# Patient Record
Sex: Male | Born: 1945
Health system: Southern US, Community
[De-identification: ages and names within clinical notes are randomized; demographics above are authoritative.]

## PROBLEM LIST (undated history)

## (undated) DIAGNOSIS — R351 Nocturia: Secondary | ICD-10-CM

## (undated) DIAGNOSIS — Z87448 Personal history of other diseases of urinary system: Secondary | ICD-10-CM

## (undated) DIAGNOSIS — I8390 Asymptomatic varicose veins of unspecified lower extremity: Secondary | ICD-10-CM

## (undated) DIAGNOSIS — M7061 Trochanteric bursitis, right hip: Secondary | ICD-10-CM

## (undated) DIAGNOSIS — M1990 Unspecified osteoarthritis, unspecified site: Secondary | ICD-10-CM

## (undated) DIAGNOSIS — F329 Major depressive disorder, single episode, unspecified: Secondary | ICD-10-CM

## (undated) DIAGNOSIS — G4733 Obstructive sleep apnea (adult) (pediatric): Secondary | ICD-10-CM

## (undated) DIAGNOSIS — F32A Depression, unspecified: Secondary | ICD-10-CM

## (undated) DIAGNOSIS — G5603 Carpal tunnel syndrome, bilateral upper limbs: Secondary | ICD-10-CM

## (undated) DIAGNOSIS — K219 Gastro-esophageal reflux disease without esophagitis: Secondary | ICD-10-CM

## (undated) DIAGNOSIS — I10 Essential (primary) hypertension: Secondary | ICD-10-CM

## (undated) DIAGNOSIS — K5790 Diverticulosis of intestine, part unspecified, without perforation or abscess without bleeding: Secondary | ICD-10-CM

## (undated) DIAGNOSIS — I4892 Unspecified atrial flutter: Secondary | ICD-10-CM

## (undated) DIAGNOSIS — Z8719 Personal history of other diseases of the digestive system: Secondary | ICD-10-CM

## (undated) DIAGNOSIS — E785 Hyperlipidemia, unspecified: Secondary | ICD-10-CM

## (undated) HISTORY — DX: Asymptomatic varicose veins of unspecified lower extremity: I83.90

## (undated) HISTORY — DX: Hyperlipidemia, unspecified: E78.5

## (undated) HISTORY — DX: Depression, unspecified: F32.A

## (undated) HISTORY — DX: Personal history of other diseases of urinary system: Z87.448

## (undated) HISTORY — DX: Nocturia: R35.1

## (undated) HISTORY — PX: EYE SURGERY: SHX253

## (undated) HISTORY — DX: Personal history of other diseases of the digestive system: Z87.19

## (undated) HISTORY — DX: Unspecified osteoarthritis, unspecified site: M19.90

## (undated) HISTORY — DX: Essential (primary) hypertension: I10

## (undated) HISTORY — DX: Diverticulosis of intestine, part unspecified, without perforation or abscess without bleeding: K57.90

## (undated) HISTORY — DX: Gastro-esophageal reflux disease without esophagitis: K21.9

## (undated) HISTORY — DX: Major depressive disorder, single episode, unspecified: F32.9

---

## 2003-05-24 ENCOUNTER — Ambulatory Visit (HOSPITAL_COMMUNITY): Admission: RE | Admit: 2003-05-24 | Discharge: 2003-05-24 | Payer: Self-pay | Admitting: Gastroenterology

## 2004-05-20 ENCOUNTER — Encounter: Admission: RE | Admit: 2004-05-20 | Discharge: 2004-05-20 | Payer: Self-pay | Admitting: Gastroenterology

## 2006-05-30 ENCOUNTER — Encounter: Admission: RE | Admit: 2006-05-30 | Discharge: 2006-05-30 | Payer: Self-pay | Admitting: Gastroenterology

## 2010-08-08 ENCOUNTER — Encounter: Payer: Self-pay | Admitting: Gastroenterology

## 2011-07-02 ENCOUNTER — Other Ambulatory Visit: Payer: Self-pay

## 2011-07-02 DIAGNOSIS — I83893 Varicose veins of bilateral lower extremities with other complications: Secondary | ICD-10-CM

## 2011-07-22 ENCOUNTER — Encounter: Payer: Self-pay | Admitting: Vascular Surgery

## 2011-07-23 ENCOUNTER — Encounter: Payer: Self-pay | Admitting: Vascular Surgery

## 2011-07-26 ENCOUNTER — Encounter: Payer: Self-pay | Admitting: Vascular Surgery

## 2011-07-26 ENCOUNTER — Ambulatory Visit (INDEPENDENT_AMBULATORY_CARE_PROVIDER_SITE_OTHER): Payer: MEDICARE | Admitting: Vascular Surgery

## 2011-07-26 ENCOUNTER — Other Ambulatory Visit (INDEPENDENT_AMBULATORY_CARE_PROVIDER_SITE_OTHER): Payer: MEDICARE | Admitting: *Deleted

## 2011-07-26 VITALS — BP 143/67 | HR 55 | Resp 18 | Ht 72.0 in | Wt 230.0 lb

## 2011-07-26 DIAGNOSIS — I83893 Varicose veins of bilateral lower extremities with other complications: Secondary | ICD-10-CM | POA: Insufficient documentation

## 2011-07-26 DIAGNOSIS — I8310 Varicose veins of unspecified lower extremity with inflammation: Secondary | ICD-10-CM

## 2011-07-26 NOTE — Progress Notes (Signed)
Subjective:     Patient ID: Jason Beard, male   DOB: [birth year], 66 y.o.   MRN: [id number]  HPI this healthy 66 year old male patient was referred for severe venous insufficiency both legs right worse than left. He has been having enlarging varicosities in the medial thigh areas bilaterally right worse than left. These have begun causing aching throbbing and burning discomfort as the day progresses. He also developed swelling in the ankle and foot as the day progresses. He has no history of DVT, thrombophlebitis, stasis ulcers, bleeding, or other complications. He has had these evaluated in the past at [facility] and recommendation for closure of the veins was made but this never occurred. He wore elastic compression stockings intermittently at that time but has not been wearing them on a regular basis.  Past Medical History  Diagnosis Date  . Hyperlipidemia   . History of hemorrhoids   . Asthma   . GERD (gastroesophageal reflux disease)   . Arthritis     DJD  . Nocturia   . Depression   . Diverticulosis   . History of simple renal cyst   . Diabetes mellitus   . Varicose veins     Left Leg    History  Substance Use Topics  . Smoking status: Former Smoker -- 30 years    Quit date: 07/19/1984  . Smokeless tobacco: Not on file  . Alcohol Use: 0.6 oz/week    1 Glasses of wine per week    Family History  Problem Relation Age of Onset  . Cancer Mother   . Heart disease Father   . Cancer Sister     Allergies  Allergen Reactions  . Ampicillin     Current outpatient prescriptions:aspirin 81 MG tablet, Take 81 mg by mouth daily as needed. , Disp: , Rfl: ;  atorvastatin (LIPITOR) 10 MG tablet, Take 10 mg by mouth daily.  , Disp: , Rfl: ;  Coenzyme Q10 (COQ10) 200 MG CAPS, Take by mouth daily.  , Disp: , Rfl: ;  fluticasone (FLONASE) 50 MCG/ACT nasal spray, Place 2 sprays into the nose daily.  , Disp: , Rfl: ;  Glucosamine 500 MG TABS, Take 2 tablets by mouth 2 (two) times  daily. , Disp: , Rfl:  metFORMIN (GLUCOPHAGE) 500 MG tablet, Take 500 mg by mouth 1 day or 1 dose. , Disp: , Rfl: ;  naproxen sodium (ANAPROX) 220 MG tablet, Take 220 mg by mouth as needed.  , Disp: , Rfl: ;  Omega-3 Fatty Acids (FISH OIL) 1000 MG CAPS, Take by mouth daily.  , Disp: , Rfl: ;  zolpidem (AMBIEN) 10 MG tablet, Take 5 mg by mouth at bedtime as needed. , Disp: , Rfl:   BP 143/67  Pulse 55  Resp 18  Ht 6' (1.829 m)  Wt 230 lb (104.327 kg)  BMI 31.19 kg/m2  Body mass index is 31.19 kg/(m^2).             Review of Systems he denies chest pain, dyspnea on exertion, PND, orthopnea, or hemoptysis. He also denies lateralizing weakness, amaurosis fugax, diplopia, or syncope. All other systems are negative and complete review of systems     Objective:   Physical Exam pressure 144/67 heart rate 65 respirations 18   general well-developed well-nourished male in no apparent distress alert and oriented x3 HEENT normal for age Lungs no rhonchi or wheezing Cardiovascular regular rhythm no murmurs carotid pulses 3+ no bruits audible Abdomen soft nontender no palpable  masses Neurologic normal Musculoskeletal 3 major deformities Extremities 3+ femoral popliteal dorsalis pedis and posterior tibial pulses palpable bilaterally. There are bulging varicosities in the right medial thigh with multiple patterns of reticular veins. There is 1+ edema distally on the right. Left leg is no hyperpigmentation or ulceration but there are bulging reticular veins in the distal medial thigh.  Today I ordered bilateral venous duplex exam which I reviewed and interpreted. Both great saphenous veins have gross reflux from the knee to the saphenofemoral junction and the deep systems are normal. Assessment:     Bilateral venous insufficiency with reflux bilateral great saphenous vein causing pain-affecting daily living    Plan:     #1 long-leg elastic compression stockings 20-30 mm gradient #2 elevate  legs as much as possible during the day #3 ibuprofen on a daily basis Patient to return in 3 months if no improvement he would be good candidate for #1 laser ablation right great saphenous vein all about #2 laser ablation left great saphenous vein. Patient would then return in 3 months to see if stab phlebectomy and/or sclerotherapy would be indicated on right leg

## 2011-07-28 ENCOUNTER — Encounter (INDEPENDENT_AMBULATORY_CARE_PROVIDER_SITE_OTHER): Payer: MEDICARE

## 2011-07-28 DIAGNOSIS — I83893 Varicose veins of bilateral lower extremities with other complications: Secondary | ICD-10-CM

## 2011-08-03 NOTE — Procedures (Unsigned)
LOWER EXTREMITY VENOUS REFLUX EXAM  INDICATION:  Bilateral lower extremity varicose veins.  EXAM:  Using color-flow imaging and pulse Doppler spectral analysis, the right and left common femoral, superficial femoral, popliteal, posterior tibial, greater and lesser saphenous veins are evaluated.  There is evidence suggesting deep venous insufficiency in the right and left lower extremities.  The right and left saphenofemoral junction is not competent with Reflux of >531milliseconds. The right and left GSV is not competent with Reflux of >500 milliseconds with the caliber as described below.   The right and left proximal short saphenous vein demonstrates competency.  GSV Diameter (used if found to be incompetent only)                                           Right    Left Proximal Greater Saphenous Vein           0.88 cm  0.63 cm Proximal-to-mid-thigh                     0.55 cm  0.52 cm Mid thigh                                 0.48 cm  0.42 cm Mid-distal thigh                          cm       cm Distal thigh                              0.40 cm  0.38 cm Knee                                      0.40 cm  0.32 cm  IMPRESSION: 1. The right and left greater saphenous veins are not competent with     reflux >500 milliseconds. 2. The right and left greater saphenous vein is not tortuous. 3. The deep venous system is not competent with Reflux of >500     milliseconds. 4. The right and left small saphenous vein is not competent.            ___________________________________________ Quita Skye. Hart Rochester, M.D.  EM/MEDQ  D:  07/26/2011  T:  07/26/2011  Job:  [id number]

## 2011-09-27 ENCOUNTER — Encounter: Payer: Self-pay | Admitting: Vascular Surgery

## 2011-09-27 ENCOUNTER — Other Ambulatory Visit: Payer: Self-pay

## 2011-10-22 ENCOUNTER — Encounter: Payer: Self-pay | Admitting: Vascular Surgery

## 2011-10-25 ENCOUNTER — Ambulatory Visit (INDEPENDENT_AMBULATORY_CARE_PROVIDER_SITE_OTHER): Payer: Medicare Other | Admitting: Vascular Surgery

## 2011-10-25 ENCOUNTER — Encounter: Payer: Self-pay | Admitting: Vascular Surgery

## 2011-10-25 VITALS — BP 132/80 | HR 50 | Resp 20 | Ht 73.0 in | Wt 245.0 lb

## 2011-10-25 DIAGNOSIS — I83893 Varicose veins of bilateral lower extremities with other complications: Secondary | ICD-10-CM

## 2011-10-25 NOTE — Progress Notes (Signed)
Subjective:     Patient ID: Jason Beard, male   DOB: 1946/03/20, 66 y.o.   MRN: [id number]  HPI this 66 year old male returns for continued followup regarding his severe venous insufficiency of both lower extremities. He has painful varicosities in the distal edema the to reflux in both great saphenous veins. He has worn her long leg elastic compression stockings 20-30 mm gradient and tried elevation and ibuprofen and this has not relieved his symptoms. The symptoms continued to worsen and are affecting his daily living.  Past Medical History  Diagnosis Date  . Hyperlipidemia   . History of hemorrhoids   . Asthma   . GERD (gastroesophageal reflux disease)   . Arthritis     DJD  . Nocturia   . Depression   . Diverticulosis   . History of simple renal cyst   . Diabetes mellitus   . Varicose veins     Left Leg    History  Substance Use Topics  . Smoking status: Former Smoker -- 30 years    Types: Cigarettes    Quit date: 07/19/1984  . Smokeless tobacco: Former Neurosurgeon    Quit date: 10/24/1961  . Alcohol Use: 0.6 oz/week    1 Glasses of wine per week    Family History  Problem Relation Age of Onset  . Cancer Mother   . Heart disease Father   . Cancer Sister     Allergies  Allergen Reactions  . Ampicillin     Current outpatient prescriptions:aspirin 81 MG tablet, Take 81 mg by mouth daily as needed. , Disp: , Rfl: ;  atorvastatin (LIPITOR) 10 MG tablet, Take 10 mg by mouth daily.  , Disp: , Rfl: ;  Coenzyme Q10 (COQ10) 200 MG CAPS, Take by mouth daily.  , Disp: , Rfl: ;  fluticasone (FLONASE) 50 MCG/ACT nasal spray, Place 2 sprays into the nose daily.  , Disp: , Rfl: ;  Glucosamine 500 MG TABS, Take 2 tablets by mouth 2 (two) times daily. , Disp: , Rfl:  metFORMIN (GLUCOPHAGE) 500 MG tablet, Take 500 mg by mouth 1 day or 1 dose. , Disp: , Rfl: ;  Omega-3 Fatty Acids (FISH OIL) 1000 MG CAPS, Take by mouth daily.  , Disp: , Rfl: ;  zolpidem (AMBIEN) 10 MG tablet, Take 5 mg  by mouth at bedtime as needed. , Disp: , Rfl: ;  naproxen sodium (ANAPROX) 220 MG tablet, Take 220 mg by mouth as needed.  , Disp: , Rfl:   BP 132/80  Pulse 50  Resp 20  Ht 6\' 1"  (1.854 m)  Wt 245 lb (111.131 kg)  BMI 32.32 kg/m2  Body mass index is 32.32 kg/(m^2).          Review of Systems     Objective:   Physical Exam pressure 130/80 heart rate 50 respirations 20 General well-developed well-nourished male in no apparent distress alert and oriented x3 Lower extremity exam-3+ femoral popliteal and dorsalis pedis pulses palpable bilaterally. Right leg with bulging varicosities in the distal medial thigh a large patch of reticular and spider veins extending down into the medial calf with 1+ edema. Left leg with bulging varicosities in the medial thigh and medial calf and 1+ edema.    Assessment:     Severe venous insufficiency with bilateral great saphenous reflux due to a valvular incompetence supplying bulging varicosities-symptoms resistant to conservative measures and affecting his daily living    Plan:     #1 laser ablation right  great saphenous vein followed by a #2 laser ablation left great saphenous vein #3 return in 3 months to see if sclerotherapy or stab phlebectomy would be needed at that time Will proceed with precertification to perform this in the near future

## 2011-11-01 ENCOUNTER — Other Ambulatory Visit: Payer: Self-pay | Admitting: *Deleted

## 2011-11-01 DIAGNOSIS — I83893 Varicose veins of bilateral lower extremities with other complications: Secondary | ICD-10-CM

## 2012-01-03 ENCOUNTER — Other Ambulatory Visit: Payer: Medicare Other | Admitting: Vascular Surgery

## 2012-01-11 ENCOUNTER — Ambulatory Visit: Payer: Medicare Other | Admitting: Vascular Surgery

## 2012-04-26 ENCOUNTER — Telehealth: Payer: Self-pay | Admitting: *Deleted

## 2012-04-26 NOTE — Telephone Encounter (Signed)
Called the patient because his DOS will expire at the end of this year. He has two laser procedures to schedule. (He cancelled his first one back in June.) I was able to get [facility] to extend the DOS. Tried to explain to him that if he continues to put these off, insurance may deny him if he tries to resubmit claiming he must not be in too much discomfort if he is able to postpone over and over. He was a bit impatient and agitated with me. I told him I was not trying to talk him into anything...that this is an elective procedure..I just wanted to be sure he understood the risk he was taking as far as the insurance issue. He wants to just proceed with the right leg on Dec. 9th and doesn't want to treat the left leg.

## 2012-06-23 ENCOUNTER — Encounter: Payer: Self-pay | Admitting: Vascular Surgery

## 2012-06-26 ENCOUNTER — Ambulatory Visit (INDEPENDENT_AMBULATORY_CARE_PROVIDER_SITE_OTHER): Payer: Medicare Other | Admitting: Vascular Surgery

## 2012-06-26 ENCOUNTER — Encounter: Payer: Self-pay | Admitting: Vascular Surgery

## 2012-06-26 VITALS — BP 137/77 | HR 62 | Resp 18 | Ht 73.0 in | Wt 250.0 lb

## 2012-06-26 DIAGNOSIS — I83893 Varicose veins of bilateral lower extremities with other complications: Secondary | ICD-10-CM

## 2012-06-26 NOTE — Progress Notes (Signed)
Subjective:     Patient ID: Jason Beard, male   DOB: 1945/09/09, 66 y.o.   MRN: [id number]  HPI this 66 year old male had laser ablation right great saphenous vein performed under local tumescent anesthesia for venous hypertension and distal edema a total of 2250 J of energy was utilized. The patient tolerated the procedure well..Review of Systems     Objective:   Physical ExamBP 137/77  Pulse 62  Resp 18  Ht 6\' 1"  (1.854 m)  Wt 250 lb (113.399 kg)  BMI 32.98 kg/m2      Assessment:     Well-tolerated laser ablation right great saphenous vein performed under local tumescent anesthesia    Plan:     Return in one week for venous duplex exam to confirm closure right great saphenous vein Will then need identical procedure on contralateral left leg near future

## 2012-06-26 NOTE — Progress Notes (Signed)
Laser Ablation Procedure      Date: 06/26/2012    Jason Beard DOB:10-25-1945  Consent signed: Yes  Surgeon:J.D. Hart Rochester  Procedure: Laser Ablation: right Greater Saphenous Vein  BP 137/77  Pulse 62  Resp 18  Ht 6\' 1"  (1.854 m)  Wt 250 lb (113.399 kg)  BMI 32.98 kg/m2  Start time: 11:00   End time: 11:45  Tumescent Anesthesia: 375 cc 0.9% NaCl with 50 cc Lidocaine HCL with 1% Epi and 15 cc 8.4% NaHCO3  Local Anesthesia: 5 cc Lidocaine HCL and NaHCO3 (ratio 2:1)  Pulsed mode: Watts 15 Seconds 1 Pulses:1 Total Pulses:149 Total Energy: 2235 Total Time: 2:28     Patient tolerated procedure well: Yes  Notes:   Description of Procedure:  After marking the course of the saphenous vein and the secondary varicosities in the standing position, the patient was placed on the operating table in the supine position, and the right leg was prepped and draped in sterile fashion. Local anesthetic was administered, and under ultrasound guidance the saphenous vein was accessed with a micro needle and guide wire; then the micro puncture sheath was placed. A guide wire was inserted to the saphenofemoral junction, followed by a 5 french sheath.  The position of the sheath and then the laser fiber below the junction was confirmed using the ultrasound and visualization of the aiming beam.  Tumescent anesthesia was administered along the course of the saphenous vein using ultrasound guidance. Protective laser glasses were placed on the patient, and the laser was fired at 15 watt pulsed mode advancing 1-2 mm per sec.  For a total of 2234 joules.  A steri strip was applied to the puncture site.    ABD pads and thigh high compression stockings were applied.  Ace wrap bandages were applied over the phlebectomy sites and at the top of the saphenofemoral junction.  Blood loss was less than 15 cc.  The patient ambulated out of the operating room having tolerated the procedure well.

## 2012-06-27 ENCOUNTER — Telehealth: Payer: Self-pay | Admitting: *Deleted

## 2012-06-27 NOTE — Telephone Encounter (Signed)
Pt doing well. No problems or pain. Following all instructions. Reminded him of his fu appts next week.

## 2012-06-28 ENCOUNTER — Encounter: Payer: Self-pay | Admitting: Vascular Surgery

## 2012-06-30 ENCOUNTER — Encounter: Payer: Self-pay | Admitting: Vascular Surgery

## 2012-07-03 ENCOUNTER — Encounter: Payer: Self-pay | Admitting: Vascular Surgery

## 2012-07-03 ENCOUNTER — Ambulatory Visit (INDEPENDENT_AMBULATORY_CARE_PROVIDER_SITE_OTHER): Payer: Medicare Other | Admitting: Vascular Surgery

## 2012-07-03 ENCOUNTER — Encounter (INDEPENDENT_AMBULATORY_CARE_PROVIDER_SITE_OTHER): Payer: Medicare Other | Admitting: Vascular Surgery

## 2012-07-03 VITALS — BP 155/93 | HR 65 | Resp 20 | Ht 73.0 in | Wt 250.0 lb

## 2012-07-03 DIAGNOSIS — M7989 Other specified soft tissue disorders: Secondary | ICD-10-CM

## 2012-07-03 DIAGNOSIS — I83893 Varicose veins of bilateral lower extremities with other complications: Secondary | ICD-10-CM

## 2012-07-03 NOTE — Progress Notes (Signed)
Subjective:     Patient ID: Jason Beard, male   DOB: 12-04-1945, 66 y.o.   MRN: [id number]  HPI this 66 year old male returns 1 week post laser ablation right great saphenous vein performed for venous hypertension with swelling. He has done well since his procedure. He has taken ibuprofen as prescribed in 1 long-leg elastic compression stocking. He has had some mild discomfort in the proximal thigh area near the saphenofemoral junction. No change in distal edema.  Past Medical History  Diagnosis Date  . Hyperlipidemia   . History of hemorrhoids   . Asthma   . GERD (gastroesophageal reflux disease)   . Arthritis     DJD  . Nocturia   . Depression   . Diverticulosis   . History of simple renal cyst   . Diabetes mellitus   . Varicose veins     Left Leg    History  Substance Use Topics  . Smoking status: Former Smoker -- 30 years    Types: Cigarettes    Quit date: 07/19/1984  . Smokeless tobacco: Former Neurosurgeon    Quit date: 10/24/1961  . Alcohol Use: 0.6 oz/week    1 Glasses of wine per week    Family History  Problem Relation Age of Onset  . Cancer Mother   . Heart disease Father   . Cancer Sister     Allergies  Allergen Reactions  . Ampicillin     Current outpatient prescriptions:aspirin 81 MG tablet, Take 81 mg by mouth daily as needed. , Disp: , Rfl: ;  atorvastatin (LIPITOR) 10 MG tablet, Take 10 mg by mouth daily.  , Disp: , Rfl: ;  Coenzyme Q10 (COQ10) 200 MG CAPS, Take by mouth daily.  , Disp: , Rfl: ;  fluticasone (FLONASE) 50 MCG/ACT nasal spray, Place 2 sprays into the nose daily.  , Disp: , Rfl: ;  Glucosamine 500 MG TABS, Take 2 tablets by mouth 2 (two) times daily. , Disp: , Rfl:  metFORMIN (GLUCOPHAGE) 500 MG tablet, Take 500 mg by mouth 1 day or 1 dose. , Disp: , Rfl: ;  naproxen sodium (ANAPROX) 220 MG tablet, Take 220 mg by mouth as needed.  , Disp: , Rfl: ;  Omega-3 Fatty Acids (FISH OIL) 1000 MG CAPS, Take by mouth daily.  , Disp: , Rfl: ;  zolpidem  (AMBIEN) 10 MG tablet, Take 5 mg by mouth at bedtime as needed. , Disp: , Rfl:   BP 155/93  Pulse 65  Resp 20  Ht 6\' 1"  (1.854 m)  Wt 250 lb (113.399 kg)  BMI 32.98 kg/m2  Body mass index is 32.98 kg/(m^2).           Review of Systems denies chest pain, dyspnea on exertion, PND, orthopnea, hemoptysis, claudication. Has had a recent sinus infection which is clearing up.     Objective:   Physical Exam blood pressure 155/93 heart rate 64 respirations right leg with minimal tenderness along courser great saphenous vein. No distal edema noted. 3+ dorsalis pedis pulse palpable.  General well-developed well-nourished male no apparent stress alert and oriented x3 Lungs no rhonchi or wheezing  Today I ordered a venous duplex exam of the right leg which are reviewed and interpreted. There is no DVT. The right great saphenous vein has been totally ablated from the distal thigh to the saphenofemoral junction. Left leg has some bulging varicosities in the medial thigh of the great saphenous system as well as 2 large patches of spider veins  and a normal distal edema.    Assessment:     Successful ablation right great saphenous vein for venous hypertension.-No DVT     Plan:     #1 continue elastic compression stockings left leg for documented venous hypertension with reflux left great saphenous system #2 he will consider sclerotherapy for patches of spider veins right lower extremity

## 2012-11-09 ENCOUNTER — Other Ambulatory Visit: Payer: Self-pay | Admitting: *Deleted

## 2012-11-09 ENCOUNTER — Telehealth: Payer: Self-pay | Admitting: *Deleted

## 2012-11-09 ENCOUNTER — Encounter: Payer: Self-pay | Admitting: *Deleted

## 2012-11-09 DIAGNOSIS — I4892 Unspecified atrial flutter: Secondary | ICD-10-CM

## 2012-11-09 NOTE — Telephone Encounter (Signed)
Call placed to patient per Dr. Graciela Husbands to schedule for an atrial flutter ablation with general anesthesia per his conversation with Dr. Eldridge Dace. Eliquis started yesterday. Per Dr. Graciela Husbands ablation will need to be 4 weeks out. I have spoken with the patient and offered him 5/23 for ablation, but he will be out of town for his grand-daughter's graduation. We will schedule his ablation for 6/12. He will see Dr. Graciela Husbands in the office on 5/15. Mailing address verified. I will mail him an appointment reminder and ablation instructions. He is agreeable.

## 2012-11-30 ENCOUNTER — Encounter: Payer: Self-pay | Admitting: Internal Medicine

## 2012-11-30 ENCOUNTER — Ambulatory Visit (INDEPENDENT_AMBULATORY_CARE_PROVIDER_SITE_OTHER): Payer: Medicare Other | Admitting: Internal Medicine

## 2012-11-30 VITALS — BP 124/72 | HR 94 | Ht 73.0 in | Wt 238.0 lb

## 2012-11-30 DIAGNOSIS — I4892 Unspecified atrial flutter: Secondary | ICD-10-CM

## 2012-11-30 NOTE — Progress Notes (Signed)
 ELECTROPHYSIOLOGY CONSULT NOTE  Patient ID: Jason Beard, MRN: [id number], DOB/AGE: 69/19/1947 69 y.o. Admit date: (Not on file) Date of Consult: 11/30/2012  Primary Physician: HUSAIN,KARRAR, MD Primary Cardiologist: JV  Chief Complaint: atrial flutter   HPI Jason Beard is a 67 y.o. male  Referred for consideration of catheter ablation of atrial flutter. He presented about a month ago with flutter at 2-1; there is significant symptoms of exercise intolerance lightheadedness. He was started on apixaban b and  he was treated with rate control with diltiazem and he is feeling much better at this time    Thromboembolic risk factors are notable for diabetes and age for a CHADS-VASc score of 2. He has treated obstructive sleep apnea.    Past Medical History  Diagnosis Date  . Hyperlipidemia   . History of hemorrhoids   . Asthma   . GERD (gastroesophageal reflux disease)   . Arthritis     DJD  . Nocturia   . Depression   . Diverticulosis   . History of simple renal cyst   . Diabetes mellitus   . Varicose veins     Left Leg      Surgical History: No past surgical history on file.   Home Meds: Prior to Admission medications   Medication Sig Start Date End Date Taking? Authorizing Provider  apixaban (ELIQUIS) 5 MG TABS tablet Take 5 mg by mouth 2 (two) times daily.   Yes Historical Provider, MD  atorvastatin (LIPITOR) 10 MG tablet Take 10 mg by mouth daily.     Yes Historical Provider, MD  Coenzyme Q10 (COQ10) 200 MG CAPS Take by mouth daily.     Yes Historical Provider, MD  diltiazem (DILACOR XR) 180 MG 24 hr capsule Take 180 mg by mouth daily.   Yes Historical Provider, MD  Glucosamine 500 MG TABS Take 2 tablets by mouth 2 (two) times daily.    Yes Historical Provider, MD  metFORMIN (GLUCOPHAGE) 500 MG tablet Take 500 mg by mouth 1 day or 1 dose.    Yes Historical Provider, MD  Omega-3 Fatty Acids (FISH OIL) 1000 MG CAPS Take by mouth daily.     Yes Historical  Provider, MD  zolpidem (AMBIEN) 10 MG tablet Take 5 mg by mouth at bedtime as needed.    Yes Historical Provider, MD      Allergies:  Allergies  Allergen Reactions  . Ampicillin     History   Social History  . Marital Status: Married    Spouse Name: N/A    Number of Children: N/A  . Years of Education: N/A   Occupational History  . Not on file.   Social History Main Topics  . Smoking status: Former Smoker -- 30 years    Types: Cigarettes    Quit date: 07/19/1984  . Smokeless tobacco: Former User    Quit date: 10/24/1961  . Alcohol Use: 0.6 oz/week    1 Glasses of wine per week  . Drug Use: No  . Sexually Active: Not on file   Other Topics Concern  . Not on file   Social History Narrative  . No narrative on file     Family History  Problem Relation Age of Onset  . Cancer Mother   . Heart disease Father   . Cancer Sister      ROS:  Please see the history of present illness.     All other systems reviewed and negative.    Physical Exam:     Blood pressure 124/72, pulse 94, height 6' 1" (1.854 m), weight 238 lb (107.956 kg). General: Well developed, well nourished obese  male in no acute distress. Head: Normocephalic, atraumatic, sclera non-icteric, no xanthomas, nares are without discharge. EENT: normal Lymph Nodes:  none Back: without scoliosis/kyphosis , no CVA tendersness Neck: Negative for carotid bruits. JVD not elevated. Lungs: Clear bilaterally to auscultation without wheezes, rales, or rhonchi. Breathing is unlabored. Heart: irregularly irregular rate and rhythm with S1 S2. 2/6 systolic  murmur , rubs, or gallops appreciated. Abdomen: Soft, non-tender, non-distended with normoactive bowel sounds. No hepatomegaly. No rebound/guarding. No obvious abdominal masses. Msk:  Strength and tone appear normal for age. Extremities: No clubbing or cyanosis. No  edema.  Distal pedal pulses are 2+ and equal bilaterally. Skin: Warm and Dry Neuro: Alert and oriented  X 3. CN III-XII intact Grossly normal sensory and motor function . Psych:  Responds to questions appropriately with a normal affect.      Labs: Cardiac Enzymes  Radiology/Studies:  No results found.  EKG: atrial flutter with typical sawtooth seen with 2:1--4:1 conduction Echocardiogram demonstrated ejection fraction 60-65% mild left atrial enlargement-5/14  Assessment and Plan:   Vanetta Rule   

## 2012-11-30 NOTE — Assessment & Plan Note (Signed)
Patient has atrial flutter with a CHADS-VASc score of 2: diabetes-1, age-60. We discussed treatment options including catheter ablation versus ongoing anticoagulant therapy. We discussed potential for heart block and stroke associated with catheter ablation and and utilized if that his risk of stroke 2% and absence of anticoagulants N/A and 60-80% reduction in the presence of apixaban. He would like to proceed with catheter ablation which is scheduled 6/12. We then with general anesthesia. He does have a history of sleep apnea.

## 2012-12-03 ENCOUNTER — Other Ambulatory Visit: Payer: Self-pay | Admitting: Internal Medicine

## 2012-12-03 DIAGNOSIS — I4892 Unspecified atrial flutter: Secondary | ICD-10-CM

## 2012-12-15 ENCOUNTER — Encounter (HOSPITAL_COMMUNITY): Payer: Self-pay | Admitting: Pharmacy Technician

## 2012-12-21 ENCOUNTER — Other Ambulatory Visit (INDEPENDENT_AMBULATORY_CARE_PROVIDER_SITE_OTHER): Payer: Medicare Other

## 2012-12-21 DIAGNOSIS — I4892 Unspecified atrial flutter: Secondary | ICD-10-CM

## 2012-12-21 LAB — CBC WITH DIFFERENTIAL/PLATELET
Basophils Absolute: 0.1 10*3/uL (ref 0.0–0.1)
Basophils Relative: 0.6 % (ref 0.0–3.0)
Eosinophils Absolute: 0.1 10*3/uL (ref 0.0–0.7)
Eosinophils Relative: 1 % (ref 0.0–5.0)
HCT: 48 % (ref 39.0–52.0)
Hemoglobin: 16.2 g/dL (ref 13.0–17.0)
Lymphocytes Relative: 13.5 % (ref 12.0–46.0)
Lymphs Abs: 1.4 10*3/uL (ref 0.7–4.0)
MCHC: 33.7 g/dL (ref 30.0–36.0)
MCV: 88.5 fl (ref 78.0–100.0)
Monocytes Absolute: 0.9 10*3/uL (ref 0.1–1.0)
Monocytes Relative: 8.7 % (ref 3.0–12.0)
Neutro Abs: 7.7 10*3/uL (ref 1.4–7.7)
Neutrophils Relative %: 76.2 % (ref 43.0–77.0)
Platelets: 186 10*3/uL (ref 150.0–400.0)
RBC: 5.42 Mil/uL (ref 4.22–5.81)
RDW: 14.3 % (ref 11.5–14.6)
WBC: 10.1 10*3/uL (ref 4.5–10.5)

## 2012-12-21 LAB — BASIC METABOLIC PANEL
BUN: 19 mg/dL (ref 6–23)
CO2: 27 mEq/L (ref 19–32)
Calcium: 9.5 mg/dL (ref 8.4–10.5)
Chloride: 105 mEq/L (ref 96–112)
Creatinine, Ser: 1 mg/dL (ref 0.4–1.5)
GFR: 78.73 mL/min (ref >60.00)
Glucose, Bld: 133 mg/dL — ABNORMAL HIGH (ref 70–99)
Potassium: 4.1 mEq/L (ref 3.5–5.1)
Sodium: 136 mEq/L (ref 135–145)

## 2012-12-21 LAB — PROTIME-INR
INR: 1.4 ratio — ABNORMAL HIGH (ref 0.8–1.0)
Prothrombin Time: 14.5 s — ABNORMAL HIGH (ref 10.2–12.4)

## 2012-12-27 MED ORDER — SODIUM CHLORIDE 0.9 % IV SOLN
INTRAVENOUS | Status: DC
  Administered 2012-12-28: 06:00:00 via INTRAVENOUS

## 2012-12-28 ENCOUNTER — Encounter (HOSPITAL_COMMUNITY): Payer: Self-pay | Admitting: Anesthesiology

## 2012-12-28 ENCOUNTER — Ambulatory Visit (HOSPITAL_COMMUNITY)
Admission: RE | Admit: 2012-12-28 | Discharge: 2012-12-28 | Disposition: A | Discharge status: Home / Self Care | Payer: Medicare Other | Source: Ambulatory Visit | Attending: Internal Medicine | Admitting: Internal Medicine

## 2012-12-28 ENCOUNTER — Ambulatory Visit (HOSPITAL_COMMUNITY): Payer: Medicare Other | Admitting: Anesthesiology

## 2012-12-28 ENCOUNTER — Encounter (HOSPITAL_COMMUNITY)
Admission: RE | Disposition: A | Discharge status: Home / Self Care | Payer: Self-pay | Source: Ambulatory Visit | Attending: Internal Medicine

## 2012-12-28 DIAGNOSIS — E119 Type 2 diabetes mellitus without complications: Secondary | ICD-10-CM | POA: Insufficient documentation

## 2012-12-28 DIAGNOSIS — G4733 Obstructive sleep apnea (adult) (pediatric): Secondary | ICD-10-CM | POA: Insufficient documentation

## 2012-12-28 DIAGNOSIS — Z7901 Long term (current) use of anticoagulants: Secondary | ICD-10-CM | POA: Insufficient documentation

## 2012-12-28 DIAGNOSIS — I4892 Unspecified atrial flutter: Secondary | ICD-10-CM | POA: Insufficient documentation

## 2012-12-28 DIAGNOSIS — K219 Gastro-esophageal reflux disease without esophagitis: Secondary | ICD-10-CM | POA: Insufficient documentation

## 2012-12-28 DIAGNOSIS — E785 Hyperlipidemia, unspecified: Secondary | ICD-10-CM | POA: Insufficient documentation

## 2012-12-28 DIAGNOSIS — M1990 Unspecified osteoarthritis, unspecified site: Secondary | ICD-10-CM | POA: Insufficient documentation

## 2012-12-28 DIAGNOSIS — J45909 Unspecified asthma, uncomplicated: Secondary | ICD-10-CM | POA: Insufficient documentation

## 2012-12-28 HISTORY — PX: ATRIAL FLUTTER ABLATION: SHX5733

## 2012-12-28 HISTORY — DX: Obstructive sleep apnea (adult) (pediatric): G47.33

## 2012-12-28 HISTORY — DX: Unspecified atrial flutter: I48.92

## 2012-12-28 LAB — GLUCOSE, CAPILLARY
Glucose-Capillary: 156 mg/dL — ABNORMAL HIGH (ref 70–99)
Glucose-Capillary: 148 mg/dL — ABNORMAL HIGH (ref 70–99)
Glucose-Capillary: 183 mg/dL — ABNORMAL HIGH (ref 70–99)
Glucose-Capillary: 152 mg/dL — ABNORMAL HIGH (ref 70–99)

## 2012-12-28 SURGERY — ATRIAL FLUTTER ABLATION
Anesthesia: Monitor Anesthesia Care

## 2012-12-28 MED ORDER — LIDOCAINE HCL 4 % MT SOLN
OROMUCOSAL | Status: DC | PRN
  Administered 2012-12-28: 4 mL via TOPICAL

## 2012-12-28 MED ORDER — SODIUM CHLORIDE 0.9 % IV SOLN
INTRAVENOUS | Status: DC | PRN
  Administered 2012-12-28: 07:00:00 via INTRAVENOUS

## 2012-12-28 MED ORDER — HYDROMORPHONE HCL PF 1 MG/ML IJ SOLN: 0.2500 mg | INTRAMUSCULAR | Status: DC | PRN

## 2012-12-28 MED ORDER — SUCCINYLCHOLINE CHLORIDE 20 MG/ML IJ SOLN
INTRAMUSCULAR | Status: DC | PRN
  Administered 2012-12-28: 100 mg via INTRAVENOUS

## 2012-12-28 MED ORDER — HEPARIN (PORCINE) IN NACL 2-0.9 UNIT/ML-% IJ SOLN
INTRAMUSCULAR | Status: AC
  Filled 2012-12-28: qty 500

## 2012-12-28 MED ORDER — ACETAMINOPHEN 325 MG PO TABS: 650.0000 mg | ORAL_TABLET | ORAL | Status: DC | PRN

## 2012-12-28 MED ORDER — METFORMIN HCL 500 MG PO TABS
500.0000 mg | ORAL_TABLET | Freq: Every day | ORAL | Status: DC
  Administered 2012-12-28: 500 mg via ORAL
  Filled 2012-12-28: qty 1

## 2012-12-28 MED ORDER — SODIUM CHLORIDE 0.9 % IJ SOLN
3.0000 mL | Freq: Two times a day (BID) | INTRAMUSCULAR | Status: DC
  Administered 2012-12-28: 3 mL via INTRAVENOUS

## 2012-12-28 MED ORDER — FENTANYL CITRATE 0.05 MG/ML IJ SOLN
INTRAMUSCULAR | Status: DC | PRN
  Administered 2012-12-28: 150 ug via INTRAVENOUS

## 2012-12-28 MED ORDER — SODIUM CHLORIDE 0.9 % IV SOLN: 250.0000 mL | INTRAVENOUS | Status: DC | PRN

## 2012-12-28 MED ORDER — ONDANSETRON HCL 4 MG/2ML IJ SOLN
INTRAMUSCULAR | Status: DC | PRN
  Administered 2012-12-28: 4 mg via INTRAVENOUS

## 2012-12-28 MED ORDER — BUPIVACAINE HCL (PF) 0.25 % IJ SOLN
INTRAMUSCULAR | Status: AC
  Filled 2012-12-28: qty 60

## 2012-12-28 MED ORDER — ATORVASTATIN CALCIUM 10 MG PO TABS
10.0000 mg | ORAL_TABLET | Freq: Every day | ORAL | Status: DC
  Administered 2012-12-28: 10 mg via ORAL
  Filled 2012-12-28: qty 1

## 2012-12-28 MED ORDER — MIDAZOLAM HCL 5 MG/5ML IJ SOLN
INTRAMUSCULAR | Status: DC | PRN
  Administered 2012-12-28: 2 mg via INTRAVENOUS

## 2012-12-28 MED ORDER — APIXABAN 5 MG PO TABS
5.0000 mg | ORAL_TABLET | Freq: Two times a day (BID) | ORAL | Status: DC
  Administered 2012-12-28: 5 mg via ORAL
  Filled 2012-12-28 (×2): qty 1

## 2012-12-28 MED ORDER — PROPOFOL 10 MG/ML IV BOLUS
INTRAVENOUS | Status: DC | PRN
  Administered 2012-12-28: 130 mg via INTRAVENOUS

## 2012-12-28 MED ORDER — ARTIFICIAL TEARS OP OINT
TOPICAL_OINTMENT | OPHTHALMIC | Status: DC | PRN
  Administered 2012-12-28: 1 via OPHTHALMIC

## 2012-12-28 MED ORDER — ONDANSETRON HCL 4 MG/2ML IJ SOLN: 4.0000 mg | Freq: Four times a day (QID) | INTRAMUSCULAR | Status: DC | PRN

## 2012-12-28 MED ORDER — SODIUM CHLORIDE 0.9 % IJ SOLN: 3.0000 mL | INTRAMUSCULAR | Status: DC | PRN

## 2012-12-28 MED ORDER — ZOLPIDEM TARTRATE 5 MG PO TABS: 10.0000 mg | ORAL_TABLET | Freq: Every evening | ORAL | Status: DC | PRN

## 2012-12-28 MED ORDER — LIDOCAINE HCL (CARDIAC) 20 MG/ML IV SOLN
INTRAVENOUS | Status: DC | PRN
  Administered 2012-12-28: 40 mg via INTRAVENOUS

## 2012-12-28 MED ORDER — SODIUM CHLORIDE 0.9 % IV SOLN
INTRAVENOUS | Status: AC
  Administered 2012-12-28: 11:00:00 via INTRAVENOUS

## 2012-12-28 NOTE — Interval H&P Note (Signed)
History and Physical Interval Note:  12/28/2012 7:13 AM  Jason Beard  has presented today for surgery, with the diagnosis of Aflutter  The various methods of treatment have been discussed with the patient and family. After consideration of risks, benefits and other options for treatment, the patient has consented to  Procedure(s): ATRIAL FLUTTER ABLATION (N/A) as a surgical intervention .  The patient's history has been reviewed, patient examined, no change in status, stable for surgery.  I have reviewed the patient's chart and labs.  Questions were answered to the patient's satisfaction.     Sherryl Manges

## 2012-12-28 NOTE — Anesthesia Postprocedure Evaluation (Signed)
  Anesthesia Post-op Note  Patient: Jason Beard  Procedure(s) Performed: Procedure(s): ATRIAL FLUTTER ABLATION (N/A)  Patient Location: PACU and Cath Lab  Anesthesia Type:General  Level of Consciousness: awake, alert  and oriented  Airway and Oxygen Therapy: Patient Spontanous Breathing and Patient connected to nasal cannula oxygen  Post-op Pain: mild  Post-op Assessment: Post-op Vital signs reviewed, Patient's Cardiovascular Status Stable, Respiratory Function Stable, Patent Airway and No signs of Nausea or vomiting  Post-op Vital Signs: Reviewed and stable  Complications: No apparent anesthesia complications

## 2012-12-28 NOTE — Anesthesia Preprocedure Evaluation (Addendum)
Anesthesia Evaluation  Patient identified by MRN, date of birth, ID band Patient awake    Reviewed: Allergy & Precautions, H&P , NPO status , Patient's Chart, lab work & pertinent test results  Airway Mallampati: II TM Distance: >3 FB Neck ROM: Full    Dental no notable dental hx. (+) Teeth Intact and Dental Advisory Given   Pulmonary asthma ,  breath sounds clear to auscultation  Pulmonary exam normal       Cardiovascular + Peripheral Vascular Disease negative cardio ROS  + dysrhythmias Atrial Fibrillation Rhythm:Irregular Rate:Normal     Neuro/Psych PSYCHIATRIC DISORDERS negative neurological ROS     GI/Hepatic Neg liver ROS, GERD-  Controlled,  Endo/Other  diabetes, Type 2, Oral Hypoglycemic Agents  Renal/GU negative Renal ROS  negative genitourinary   Musculoskeletal   Abdominal   Peds  Hematology negative hematology ROS (+)   Anesthesia Other Findings   Reproductive/Obstetrics negative OB ROS                          Anesthesia Physical Anesthesia Plan  ASA: III  Anesthesia Plan: General   Post-op Pain Management:    Induction: Intravenous  Airway Management Planned: LMA  Additional Equipment:   Intra-op Plan:   Post-operative Plan: Extubation in OR  Informed Consent: I have reviewed the patients History and Physical, chart, labs and discussed the procedure including the risks, benefits and alternatives for the proposed anesthesia with the patient or authorized representative who has indicated his/her understanding and acceptance.   Dental advisory given  Plan Discussed with: CRNA and Surgeon  Anesthesia Plan Comments:        Anesthesia Quick Evaluation

## 2012-12-28 NOTE — CV Procedure (Signed)
GRAIG HESSLING [id number]  [id number]  Preop Dx: atrial flutter  Postop Dx same/   Procedure: EPS, LA mapping, ARrhythmia mapping, catheter ablation  Cx: None      Sherryl Manges, MD 12/28/2012 9:23 AM

## 2012-12-28 NOTE — H&P (View-Only) (Signed)
ELECTROPHYSIOLOGY CONSULT NOTE  Patient ID: Jason Beard, MRN: [id number], DOB/AGE: 1945/10/02 67 y.o. Admit date: (Not on file) Date of Consult: 11/30/2012  Primary Physician: Georgann Housekeeper, MD Primary Cardiologist: JV  Chief Complaint: atrial flutter   HPI Jason Beard is a 67 y.o. male  Referred for consideration of catheter ablation of atrial flutter. He presented about a month ago with flutter at 2-1; there is significant symptoms of exercise intolerance lightheadedness. He was started on apixaban b and  he was treated with rate control with diltiazem and he is feeling much better at this time    Thromboembolic risk factors are notable for diabetes and age for a CHADS-VASc score of 2. He has treated obstructive sleep apnea.    Past Medical History  Diagnosis Date  . Hyperlipidemia   . History of hemorrhoids   . Asthma   . GERD (gastroesophageal reflux disease)   . Arthritis     DJD  . Nocturia   . Depression   . Diverticulosis   . History of simple renal cyst   . Diabetes mellitus   . Varicose veins     Left Leg      Surgical History: No past surgical history on file.   Home Meds: Prior to Admission medications   Medication Sig Start Date End Date Taking? Authorizing Provider  apixaban (ELIQUIS) 5 MG TABS tablet Take 5 mg by mouth 2 (two) times daily.   Yes Historical Provider, MD  atorvastatin (LIPITOR) 10 MG tablet Take 10 mg by mouth daily.     Yes Historical Provider, MD  Coenzyme Q10 (COQ10) 200 MG CAPS Take by mouth daily.     Yes Historical Provider, MD  diltiazem (DILACOR XR) 180 MG 24 hr capsule Take 180 mg by mouth daily.   Yes Historical Provider, MD  Glucosamine 500 MG TABS Take 2 tablets by mouth 2 (two) times daily.    Yes Historical Provider, MD  metFORMIN (GLUCOPHAGE) 500 MG tablet Take 500 mg by mouth 1 day or 1 dose.    Yes Historical Provider, MD  Omega-3 Fatty Acids (FISH OIL) 1000 MG CAPS Take by mouth daily.     Yes Historical  Provider, MD  zolpidem (AMBIEN) 10 MG tablet Take 5 mg by mouth at bedtime as needed.    Yes Historical Provider, MD      Allergies:  Allergies  Allergen Reactions  . Ampicillin     History   Social History  . Marital Status: Married    Spouse Name: N/A    Number of Children: N/A  . Years of Education: N/A   Occupational History  . Not on file.   Social History Main Topics  . Smoking status: Former Smoker -- 30 years    Types: Cigarettes    Quit date: 07/19/1984  . Smokeless tobacco: Former Neurosurgeon    Quit date: 10/24/1961  . Alcohol Use: 0.6 oz/week    1 Glasses of wine per week  . Drug Use: No  . Sexually Active: Not on file   Other Topics Concern  . Not on file   Social History Narrative  . No narrative on file     Family History  Problem Relation Age of Onset  . Cancer Mother   . Heart disease Father   . Cancer Sister      ROS:  Please see the history of present illness.     All other systems reviewed and negative.    Physical Exam:  Blood pressure 124/72, pulse 94, height 6\' 1"  (1.854 m), weight 238 lb (107.956 kg). General: Well developed, well nourished obese  male in no acute distress. Head: Normocephalic, atraumatic, sclera non-icteric, no xanthomas, nares are without discharge. EENT: normal Lymph Nodes:  none Back: without scoliosis/kyphosis , no CVA tendersness Neck: Negative for carotid bruits. JVD not elevated. Lungs: Clear bilaterally to auscultation without wheezes, rales, or rhonchi. Breathing is unlabored. Heart: irregularly irregular rate and rhythm with S1 S2. 2/6 systolic  murmur , rubs, or gallops appreciated. Abdomen: Soft, non-tender, non-distended with normoactive bowel sounds. No hepatomegaly. No rebound/guarding. No obvious abdominal masses. Msk:  Strength and tone appear normal for age. Extremities: No clubbing or cyanosis. No  edema.  Distal pedal pulses are 2+ and equal bilaterally. Skin: Warm and Dry Neuro: Alert and oriented  X 3. CN III-XII intact Grossly normal sensory and motor function . Psych:  Responds to questions appropriately with a normal affect.      Labs: Cardiac Enzymes  Radiology/Studies:  No results found.  EKG: atrial flutter with typical sawtooth seen with 2:1--4:1 conduction Echocardiogram demonstrated ejection fraction 60-65% mild left atrial enlargement-5/14  Assessment and Plan:   Sherryl Manges

## 2012-12-28 NOTE — Preoperative (Signed)
Beta Blockers   Reason not to administer Beta Blockers:Not Applicable 

## 2012-12-28 NOTE — Transfer of Care (Signed)
Immediate Anesthesia Transfer of Care Note  Patient: Jason Beard  Procedure(s) Performed: Procedure(s): ATRIAL FLUTTER ABLATION (N/A)  Patient Location: PACU  Anesthesia Type:General  Level of Consciousness: awake, alert  and oriented  Airway & Oxygen Therapy: Patient Spontanous Breathing and Patient connected to nasal cannula oxygen  Post-op Assessment: Report given to PACU RN and Post -op Vital signs reviewed and stable  Post vital signs: Reviewed and stable  Complications: No apparent anesthesia complications

## 2012-12-28 NOTE — Anesthesia Procedure Notes (Signed)
Procedure Name: Intubation Date/Time: 12/28/2012 7:50 AM Performed by: Gayla Medicus Pre-anesthesia Checklist: Patient identified, Timeout performed, Emergency Drugs available, Suction available and Patient being monitored Patient Re-evaluated:Patient Re-evaluated prior to inductionOxygen Delivery Method: Circle system utilized Preoxygenation: Pre-oxygenation with 100% oxygen Intubation Type: IV induction Ventilation: Mask ventilation with difficulty, Two handed mask ventilation required and Oral airway inserted - appropriate to patient size Laryngoscope Size: Mac and 4 Grade View: Grade II Tube type: Oral Tube size: 7.5 mm Number of attempts: 1 Airway Equipment and Method: Stylet and LTA kit utilized Placement Confirmation: ETT inserted through vocal cords under direct vision,  positive ETCO2 and breath sounds checked- equal and bilateral Secured at: 22 cm Tube secured with: Tape Dental Injury: Teeth and Oropharynx as per pre-operative assessment

## 2012-12-28 NOTE — Discharge Summary (Signed)
Discharge Summary   Patient ID: Jason Beard MRN: [id number], DOB/AGE: 69-Jan-1947 69 y.o. Admit date: 12/28/2012 D/C date:     12/28/2012  Primary Cardiologist: Graciela Husbands  Primary Discharge Diagnoses:  1. Atrial flutter s/p EPS, LA mapping, Arrhythmia mapping, catheter ablation 12/28/12   Secondary Discharge Diagnoses:  . Hyperlipidemia   . History of hemorrhoids   . Asthma   . GERD (gastroesophageal reflux disease)   . Arthritis     DJD  . Nocturia   . Depression   . Diverticulosis   . History of simple renal cyst   . Diabetes mellitus   . Varicose veins     Left Leg  . OSA (obstructive sleep apnea)     Hospital Course:  Jason Beard is a 67 y/o M with history of atrial flutter, DM, treated OSA who presented to Dr. Odessa Fleming office recently for consideration of atrial flutter ablation. The patient developed 2:1 atrial flutter about 1 month prior to that appointment with significant symptoms of exercise intolerance and lightheadedness. He was started on apixaban and treated with rate control with diltiazem. Options for treatment were discussed and the patient elected to proceed with ablation. He was brought in for this procedure today and underwent EPS, LA mapping, Arrhythmia mapping, catheter ablation. Dr. Graciela Husbands has continued his apixaban but stopped his diltiazem. He has seen and examined the patient today and feels he is stable for discharge this evening. I have left a message on our office's scheduling voicemail requesting a follow-up appointment for 1 month per Dr. Graciela Husbands, and our office will call the patient with this appointment.   Discharge Vitals: Blood pressure 124/85, pulse 81, temperature 97.4 F (36.3 C), temperature source Oral, resp. rate 20, height 6\' 1"  (1.854 m), weight 231 lb 8 oz (105.008 kg), SpO2 98.00%.  Labs: none this admission Diagnostic Studies/Procedures   As above  Discharge Medications     Medication List    STOP taking these medications         diltiazem 180 MG 24 hr capsule  Commonly known as:  DILACOR XR      TAKE these medications       atorvastatin 10 MG tablet  Commonly known as:  LIPITOR  Take 10 mg by mouth daily.     CoQ10 200 MG Caps  Take 200 mg by mouth daily.     ELIQUIS 5 MG Tabs tablet  Generic drug:  apixaban  Take 5 mg by mouth 2 (two) times daily.     Fish Oil 1000 MG Caps  Take 1,000 mg by mouth daily.     Glucosamine 500 MG Tabs  Take 1 tablet by mouth daily.     metFORMIN 500 MG tablet  Commonly known as:  GLUCOPHAGE  Take 500 mg by mouth daily.     zolpidem 10 MG tablet  Commonly known as:  AMBIEN  Take 10 mg by mouth at bedtime as needed for sleep.        Disposition   The patient will be discharged in stable condition to home.     Discharge Orders   Future Orders Complete By Expires     Diet - low sodium heart healthy  As directed     Increase activity slowly  As directed     Comments:      No driving for 2 days. No lifting over 5 lbs for 1 week. No sexual activity for 1 week. Keep procedure site clean & dry. If you  notice increased pain, swelling, bleeding or pus, call/return!  You may shower, but no soaking baths/hot tubs/pools for 1 week.      Follow-up Information   Follow up with Sherryl Manges, MD. (Our office will call you for a follow-up appointment to be seen in 1 month. Please call the office if you have not heard from [place] within 3 days.)    Contact information:   [address] [place] [address] [phone]         Duration of Discharge Encounter: Greater than 30 minutes including physician and PA time.  Signed, Ronie Spies PA-C 12/28/2012, 7:10 PM

## 2012-12-29 NOTE — Op Note (Signed)
NAMEKITO, CUFFE NO.:  [id number]  MEDICAL RECORD NO.:  [id number]  LOCATION:  [id number]                        FACILITY:  [facility]  PHYSICIAN:  Duke Salvia, MD, FACCDATE OF BIRTH:  [birth year]  DATE OF PROCEDURE:  12/28/2012 DATE OF DISCHARGE:  12/28/2012                              OPERATIVE REPORT   PREOPERATIVE DIAGNOSIS:  Atrial flutter.  POSTOPERATIVE DIAGNOSIS:  Atrial flutter.  PROCEDURES:  Invasive electrophysiological study, left atrial mapping, arrhythmia mapping, RF catheter ablation.  DESCRIPTION OF THE PROCEDURE:  After obtaining informed consent, the patient was brought to the electrophysiology laboratory and placed on the fluoroscopic table in the supine position.  After routine prep and drape, the patient was submitted to general anesthesia under the care of Dr. Sampson Goon.  Following the procedure, the catheters were removed. The sheaths were retained and the patient was transferred to the holding area for sheath removal in stable condition.  Catheters of 5-French quadripolar catheter was inserted through the left femoral vein to the AV junction to measure his electrogram.  A 6-French __________ catheter was inserted in the right femoral vein to the coronary sinus.  A 7-French dual decapolar catheter was inserted via left femoral vein to the tricuspid anulus.  An 8-French 10-mm ablation tip catheter was inserted in the right femoral vein using an SAFL sheath to mapping sites in the posterior septal space.  __________ leads 1, aVF and V1 were monitored continuously throughout the procedure.  Following insertion of the catheters, the stimulation protocol included incremental atrial pacing.  Incremental ventricular pacing.  Single and double atrial extrastimuli paced cycle length of 600 milliseconds.  Entitled results for subsection is __________ electrocardiogram and basic intervals.  Rhythm initial:  Atrial flutter; RR  interval.  AA interval was 225 milliseconds, RR interval of 524 milliseconds, QT interval was 106 milliseconds; QTc interval 270 milliseconds.  AH interval N/AA; HV interval:  Thirty-eight milliseconds.  Final rhythm:  Sinus; RR interval 777 milliseconds; PR interval 156 milliseconds; P-wave duration 129 milliseconds; QRS duration 107 milliseconds; QT interval 382 milliseconds.  AH interval 78 milliseconds; HV interval 39 milliseconds.  AV nodal function.  First line AV Wenckebach:  400 milliseconds; VA Wenckebach was 250 milliseconds.  AV nodal effective refractory at 600:300 milliseconds was 340 milliseconds without evidence of dual AV nodal physiology.  Accessory pathway function:  No evidence of accessory pathway was identified.  Ventricular response to programed stimulation; normal for ventricular stimulation as described.  Arrhythmias induced.  The patient presented to the lab with atrial flutter.  Electrogram mapping confirmed cavotricuspid isthmus dependent flutter.  Catheter ablation was undertaken.  A total of 14 minutes and 40 seconds of RF energy was delivered across the cavotricuspid isthmus resulting in the termination of atrial flutter and the development of bidirectional isthmus block.  The A1, A2 interval was 140 milliseconds following catheter ablation.  Fluoroscopy time; a total of 18.7 minutes of fluoroscopy time was utilized __________ second.  IMPRESSION: 1. Normal sinus function. 2. Abnormal atrial function manifested by sustained atrial flutter     with successful catheter ablation of the cavotricuspid isthmus     dependent rhythm. 3. Normal AV nodal function. 4. Normal His-Purkinje system function. 5. No  accessory pathway. 6. Normal ventricular response to programed stimulation.  SUMMARY CONCLUSION:  The results of electrophysiological testing confirmed isthmus-dependent atrial flutter based on electrogram mapping. Catheter ablation across  the cavotricuspid isthmus successfully terminated the flutter and then resulted in bidirectional cavotricuspid isthmus conduction block eliminating the substrate then for the patient's atrial flutter.  The patient tolerated the procedure without apparent complication.     Duke Salvia, MD, [facility]     SCK/MEDQ  D:  12/28/2012  T:  12/29/2012  Job:  [id number]

## 2013-01-26 ENCOUNTER — Encounter: Payer: Self-pay | Admitting: *Deleted

## 2013-01-29 ENCOUNTER — Encounter: Payer: Self-pay | Admitting: Internal Medicine

## 2013-01-29 ENCOUNTER — Ambulatory Visit (INDEPENDENT_AMBULATORY_CARE_PROVIDER_SITE_OTHER): Payer: Self-pay | Admitting: Internal Medicine

## 2013-01-29 VITALS — BP 145/78 | HR 55 | Ht 73.0 in | Wt 242.0 lb

## 2013-01-29 DIAGNOSIS — I4892 Unspecified atrial flutter: Secondary | ICD-10-CM

## 2013-01-29 DIAGNOSIS — I10 Essential (primary) hypertension: Secondary | ICD-10-CM

## 2013-01-29 HISTORY — DX: Essential (primary) hypertension: I10

## 2013-01-29 NOTE — Patient Instructions (Signed)
Your physician recommends that you schedule a follow-up appointment in: as needed  

## 2013-01-29 NOTE — Progress Notes (Signed)
Patient Care Team: Georgann Housekeeper, MD as PCP - General (Internal Medicine) Maeola Harman, MD as Attending Physician (Neurosurgery)   HPI  Jason Beard is a 67 y.o. male Seen in followup for atrial flutter s/sp catheter ablation 6/14 He has had no clinical recurrences. His had no  Palpitations.     Past Medical History  Diagnosis Date  . Hyperlipidemia   . History of hemorrhoids   . Asthma   . GERD (gastroesophageal reflux disease)   . Arthritis     DJD  . Nocturia   . Depression   . Diverticulosis   . History of simple renal cyst   . Diabetes mellitus   . Varicose veins     Left Leg  . Atrial flutter     a. s/p ablation 12/2012.  Marland Kitchen OSA (obstructive sleep apnea)     No past surgical history on file.  Current Outpatient Prescriptions  Medication Sig Dispense Refill  . apixaban (ELIQUIS) 5 MG TABS tablet Take 5 mg by mouth 2 (two) times daily.      Marland Kitchen atorvastatin (LIPITOR) 10 MG tablet Take 10 mg by mouth daily.        . Coenzyme Q10 (COQ10) 200 MG CAPS Take 200 mg by mouth daily.       . Glucosamine 500 MG TABS Take 1 tablet by mouth daily.       . metFORMIN (GLUCOPHAGE) 500 MG tablet Take 500 mg by mouth daily.       . Omega-3 Fatty Acids (FISH OIL) 1000 MG CAPS Take 1,000 mg by mouth daily.       Marland Kitchen zolpidem (AMBIEN) 10 MG tablet Take 10 mg by mouth at bedtime as needed for sleep.        No current facility-administered medications for this visit.    Allergies  Allergen Reactions  . Ampicillin Rash    Review of Systems negative except from HPI and PMH BP 145/78  Pulse 55  Ht 6\' 1"  (1.854 m)  Wt 242 lb (109.77 kg)  BMI 31.93 kg/m2  Physical Exam Well developed and nourished in no acute distress HENT normal Neck supple with JVP-flat Clear Regular rate and rhythm, no murmurs or gallops Abd-soft with active BS No Clubbing cyanosis edema Skin-warm and dry A & Oriented  Grossly normal sensory and motor function  ECG demonstrates sinus rhythm at  55 Intervals 16/10/41 RSR prime (Ventricular hypertrophy  Assessment and  Plan

## 2013-01-29 NOTE — Assessment & Plan Note (Signed)
The patient persisted hypertension in the context of sleep apnea and diabetes I would recommend ACE inhibitor therapy and asked that he follow up with his PCP regarding this.

## 2013-02-21 ENCOUNTER — Other Ambulatory Visit: Payer: Self-pay

## 2013-05-24 ENCOUNTER — Other Ambulatory Visit: Payer: Self-pay

## 2014-05-10 ENCOUNTER — Other Ambulatory Visit: Payer: Self-pay | Admitting: *Deleted

## 2014-05-10 DIAGNOSIS — R200 Anesthesia of skin: Secondary | ICD-10-CM

## 2014-06-19 ENCOUNTER — Ambulatory Visit (INDEPENDENT_AMBULATORY_CARE_PROVIDER_SITE_OTHER): Payer: Medicare Other | Admitting: Neurology

## 2014-06-19 DIAGNOSIS — G5601 Carpal tunnel syndrome, right upper limb: Secondary | ICD-10-CM

## 2014-06-19 DIAGNOSIS — R200 Anesthesia of skin: Secondary | ICD-10-CM

## 2014-06-19 DIAGNOSIS — M5412 Radiculopathy, cervical region: Secondary | ICD-10-CM

## 2014-06-19 NOTE — Procedures (Signed)
[facility]  [address], [address]  [place], [place] [address] Tel: [phone] Fax:  [phone] Test Date:  06/19/2014  Patient: Jason Beard DOB: [birth year] Physician: Narda Amber, DO  Sex: Male Height: 6\' 1"  Ref Phys: Wenda Low  ID#: [id number] Temp: 33.6C Technician: Laureen Ochs R. NCS T.   Patient Complaints: Patient is a 68 year old male here for evaluation of numbness and tingling of is right thumb and index finger.  NCV & EMG Findings: Extensive electrodiagnostic testing of the right upper extremity shows:  1. Median sensory response is absent. Ulnar sensory response is normal and the dorsal cutaneous sensory response is mildly prolonged. 2. Median motor response shows prolonged distal latency with preserved amplitude. The ulnar motor response recording at the abductor digiti minimi and first dorsal interosseous muscle shows conduction block in the forearm and low-normal conduction velocity. 3. Chronic motor axon loss changes are seen affecting the C8-T1 myotomes, without accompanied active denervation.   Impression: 1. Median neuropathy at or distal to the wrist, consistent with the clinical diagnosis of carpal tunnel syndrome. Overall, these findings are moderate-to-severe in degree electrically. 2. Right ulnar neuropathy in the forearm, predominantly demyelinating in type. Based on the patient's primary complaints, these findings are unlikely to be clinically relevant. 3. Chronic C8 radiculopathy affecting the right upper extremity, mild in degree electrically.   ___________________________ Narda Amber, DO    Nerve Conduction Studies Anti Sensory Summary Table   Site NR Peak (ms) Norm Peak (ms) P-T Amp (V) Norm P-T Amp  Right DorsCutan Anti Sensory (Dorsum 5th MC)  Wrist    3.4 <3.2 9.0 >5  Right Median Anti Sensory (2nd Digit)  Wrist NR  <3.8  >10  Right Ulnar Anti Sensory (5th Digit)    distance 13cm  Wrist    3.2 <3.2 8.0 >5    Motor Summary Table   Site NR Onset (ms) Norm Onset (ms) O-P Amp (mV) Norm O-P Amp Site1 Site2 Delta-0 (ms) Dist (cm) Vel (m/s) Norm Vel (m/s)  Right Median Motor (Abd Poll Brev)  Wrist    6.8 <4.0 8.0 >5 Elbow Wrist 6.2 31.0 50 >50  Elbow    13.0  7.5         Right Ulnar Motor (Abd Dig Minimi)  Wrist    2.9 <3.1 8.5 >7 B Elbow Wrist 4.9 25.0 51 >50  B Elbow    7.8  6.4  A Elbow B Elbow 2.0 10.0 50 >50  A Elbow    9.8  6.2         Right Ulnar (FDI) Motor (1st DI)  Wrist    4.5 <4.5 11.3 >7 B Elbow Wrist 5.0 25.0 50 >50  B Elbow    9.5  5.4  A Elbow B Elbow 2.1 10.0 48 >50  A Elbow    11.6  4.8          EMG   Side Muscle Ins Act Fibs Psw Fasc Number Recrt Dur Dur. Amp Amp. Poly Poly. Comment  Right 1stDorInt Nml Nml Nml Nml 1- Mod-V Few 1+ Few 1+ Nml Nml N/A  Right Abd Poll Brev Nml Nml Nml Nml 1- Mod-R Few 1+ Few 1+ Nml Nml N/A  Right ABD Dig Min Nml Nml Nml Nml 1- Mod-R Some 1+ Nml Nml Nml Nml N/A  Right FlexDigProf 4,5 Nml Nml Nml Nml 1- Mod-R Some 1+ Some 1+ Nml Nml N/A  Right FlexPolLong Nml Nml Nml Nml 1- Mod-R Few  1+ Few 1+ Nml Nml N/A  Right Ext Indicis Nml Nml Nml Nml 1- Rapid Some 1+ Some 1+ Nml Nml N/A  Right PronatorTeres Nml Nml Nml Nml Nml Nml Nml Nml Nml Nml Nml Nml N/A  Right Biceps Nml Nml Nml Nml Nml Nml Nml Nml Nml Nml Nml Nml N/A  Right Triceps Nml Nml Nml Nml 1- Rapid Some 1+ Some 1+ Nml Nml N/A  Right Deltoid Nml Nml Nml Nml Nml Nml Nml Nml Nml Nml Nml Nml N/A      Waveforms:

## 2014-06-27 ENCOUNTER — Encounter (HOSPITAL_COMMUNITY): Payer: Self-pay | Admitting: Internal Medicine

## 2014-08-02 ENCOUNTER — Other Ambulatory Visit: Payer: Self-pay | Admitting: Internal Medicine

## 2014-08-02 ENCOUNTER — Ambulatory Visit
Admission: RE | Admit: 2014-08-02 | Discharge: 2014-08-02 | Disposition: A | Discharge status: Home / Self Care | Payer: Medicare Other | Source: Ambulatory Visit | Attending: Internal Medicine | Admitting: Internal Medicine

## 2014-08-02 DIAGNOSIS — R0789 Other chest pain: Secondary | ICD-10-CM

## 2014-08-20 ENCOUNTER — Ambulatory Visit (INDEPENDENT_AMBULATORY_CARE_PROVIDER_SITE_OTHER): Payer: Medicare Other | Admitting: Interventional Cardiology

## 2014-08-20 ENCOUNTER — Encounter: Payer: Self-pay | Admitting: Interventional Cardiology

## 2014-08-20 VITALS — BP 132/70 | HR 53 | Ht 73.0 in | Wt 257.4 lb

## 2014-08-20 DIAGNOSIS — E663 Overweight: Secondary | ICD-10-CM

## 2014-08-20 DIAGNOSIS — E785 Hyperlipidemia, unspecified: Secondary | ICD-10-CM | POA: Insufficient documentation

## 2014-08-20 DIAGNOSIS — R0789 Other chest pain: Secondary | ICD-10-CM

## 2014-08-20 DIAGNOSIS — E119 Type 2 diabetes mellitus without complications: Secondary | ICD-10-CM

## 2014-08-20 DIAGNOSIS — I10 Essential (primary) hypertension: Secondary | ICD-10-CM

## 2014-08-20 NOTE — Patient Instructions (Signed)
Your physician recommends that you continue on your current medications as directed. Please refer to the Current Medication list given to you today.  Your physician has requested that you have en exercise stress myoview. For further information please visit [email or web address]. Please follow instruction sheet, as given.  Your physician recommends that you schedule a follow-up appointment based on your myoview results.

## 2014-08-20 NOTE — Progress Notes (Signed)
Patient ID: Jason Beard, male   DOB: 1946-02-08, 69 y.o.   MRN: [id number]    [place], [place],   [address] Phone: [phone] Fax:  [phone]  Date:  08/20/2014   ID:  Jason Beard, DOB [birth year], MRN [id number]  PCP:  Wenda Low, MD      History of Present Illness: Jason Beard is a 69 y.o. male who had atrial flutter in 2014.  He had a successful ablation in 6/14.    Over the past 3 months, he has had intermittent chest pressure.  It is random.  It lasts several minutes.  It is a pressure in the sternum.  At other times, there can be a sharp left chest pain.  No triggers that he an think of.  He walks regularly without problems.  He plays golf without any trouble.  He has heartburn sx which are different from this.    He has gained some weight in the past 18 months since I have seen him.     Wt Readings from Last 3 Encounters:  08/20/14 257 lb 6.4 oz (116.756 kg)  01/29/13 242 lb (109.77 kg)  12/28/12 231 lb 8 oz (105.008 kg)     Past Medical History  Diagnosis Date  . Hyperlipidemia   . History of hemorrhoids   . Asthma   . GERD (gastroesophageal reflux disease)   . Arthritis     DJD  . Nocturia   . Depression   . Diverticulosis   . History of simple renal cyst   . Diabetes mellitus   . Varicose veins     Left Leg  . Atrial flutter     a. s/p ablation 12/2012.  Marland Kitchen OSA (obstructive sleep apnea)   . Essential hypertension 01/29/2013    Current Outpatient Prescriptions  Medication Sig Dispense Refill  . atorvastatin (LIPITOR) 10 MG tablet Take 10 mg by mouth daily.      . Coenzyme Q10 (COQ10) 200 MG CAPS Take 200 mg by mouth daily.     . Glucosamine 500 MG TABS Take 1 tablet by mouth daily.     Marland Kitchen lisinopril (PRINIVIL,ZESTRIL) 2.5 MG tablet Take 2.5 mg by mouth daily.  11  . metFORMIN (GLUCOPHAGE) 500 MG tablet Take 500 mg by mouth daily.     . Omega-3 Fatty Acids (FISH OIL) 1000 MG CAPS Take 1,000 mg by mouth  daily.     Marland Kitchen zolpidem (AMBIEN) 10 MG tablet Take 10 mg by mouth at bedtime as needed for sleep.      No current facility-administered medications for this visit.    Allergies:    Allergies  Allergen Reactions  . Ampicillin Rash    Social History:  The patient  reports that he quit smoking about 30 years ago. His smoking use included Cigarettes. He quit after 30 years of use. He quit smokeless tobacco use about 52 years ago. He reports that he drinks about 0.6 oz of alcohol per week. He reports that he does not use illicit drugs.   Family History:  The patient's family history includes Cancer in his mother and sister; Heart disease in his father.   ROS:  Please see the history of present illness.  No nausea, vomiting.  No fevers, chills.  No focal weakness.  No dysuria.    All other systems reviewed and negative.   PHYSICAL EXAM: VS:  BP 132/70 mmHg  Pulse 53  Ht 6\' 1"  (1.854 m)  Wt 257 lb 6.4 oz (116.756 kg)  BMI 33.97 kg/m2 General: Well developed, well nourished, in no acute distress HEENT: normal Neck: no JVD, no carotid bruits Cardiac:  normal S1, S2; RRR;  Lungs:  clear to auscultation bilaterally, no wheezing, rhonchi or rales Abd: soft, nontender, no hepatomegaly Ext: no edema Skin: warm and dry Neuro:   no focal abnormalities noted Psych: normal affect  EKG:  NSR, IRBBB, 1 mm downsloping ST depression in V1; 1 mm ST elevation in V4, V5- early repol pattern     ASSESSMENT AND PLAN:  1. Atypical chest pain:  Plan for nuclear stress test given multiple RF for CAD.  Father died at 34 of an MI.  Evaluate with nuclear given the ECG changes.  Discussed cath but sx do not sem severe enough. Plan for cath if nuclear is abnormal. 2. Obesity: He would benefit from weight loss.  3. DM and hyperlipidemia managed by PMD. LDL target < 100.  Signed, Mina Marble, MD, [facility] 08/20/2014 5:05 PM

## 2014-08-27 ENCOUNTER — Ambulatory Visit (HOSPITAL_COMMUNITY): Payer: Medicare Other | Attending: Cardiology | Admitting: Radiology

## 2014-08-27 DIAGNOSIS — R0789 Other chest pain: Secondary | ICD-10-CM | POA: Diagnosis not present

## 2014-08-27 MED ORDER — TECHNETIUM TC 99M SESTAMIBI GENERIC - CARDIOLITE
33.0000 | Freq: Once | INTRAVENOUS | Status: AC | PRN
  Administered 2014-08-27: 33 via INTRAVENOUS

## 2014-08-27 MED ORDER — TECHNETIUM TC 99M SESTAMIBI GENERIC - CARDIOLITE
11.0000 | Freq: Once | INTRAVENOUS | Status: AC | PRN
  Administered 2014-08-27: 11 via INTRAVENOUS

## 2014-08-27 NOTE — Progress Notes (Signed)
[facility] [address] [place], [place] [address] [phone]    Cardiology Nuclear Med Study  Jason Beard is a 69 y.o. male     MRN : [id number]     DOB: [birth year]  Procedure Date: 08/27/2014  Nuclear Med Background Indication for Stress Test:  Evaluation for Ischemia History:  Asthma and Hx Aflutter Ablation  Cardiac Risk Factors: Family History - CAD, Hypertension and NIDDM  Symptoms:  Chest Pain   Nuclear Pre-Procedure Caffeine/Decaff Intake:  None> 12 hrs NPO After: 7:00pm   Lungs:  clear O2 Sat: 95% on room air. IV 0.9% NS with Angio Cath:  20g  IV Site: R Antecubital x 1, tolerated well IV Started by:  Irven Baltimore, RN  Chest Size (in):  50 Cup Size: n/a  Height: 6\' 1"  (1.854 m)  Weight:  252 lb (114.306 kg)  BMI:  Body mass index is 33.25 kg/(m^2). Tech Comments:  Patient took Lisinopril this am, and Metformin last night. Irven Baltimore, RN.    Nuclear Med Study 1 or 2 day study: 1 day  Stress Test Type:  Stress  Reading MD: N/A  Order Authorizing Provider:  Larae Grooms, MD  Resting Radionuclide: Technetium 36m Sestamibi  Resting Radionuclide Dose: 11.0 mCi   Stress Radionuclide:  Technetium 1m Sestamibi  Stress Radionuclide Dose: 33.0 mCi           Stress Protocol Rest HR: 49 Stress HR: 142  Rest BP: 110/66 Stress BP: 193/79  Exercise Time (min): 6:00 METS: 7.0   Predicted Max HR: 150 bpm % Max HR: 94.67 bpm Rate Pressure Product: 27406   Dose of Adenosine (mg):  n/a Dose of Lexiscan: n/a mg  Dose of Atropine (mg): n/a Dose of Dobutamine: n/a mcg/kg/min (at max HR)  Stress Test Technologist: Perrin Maltese, EMT-P  Nuclear Technologist:  Earl Many, CNMT     Rest Procedure:  Myocardial perfusion imaging was performed at rest 45 minutes following the intravenous administration of Technetium 90m Sestamibi. Rest ECG: Sinus bradycardia. No QRS abnormality.  Stress Procedure:  The patient exercised on  the treadmill utilizing the Bruce Protocol for 6:00 minutes. The patient stopped due to fatigue and denied any chest pain.  Technetium 61m Sestamibi was injected at peak exercise and myocardial perfusion imaging was performed after a brief delay. Stress ECG: No significant change from baseline ECG  QPS Raw Data Images:  Normal; no motion artifact; normal heart/lung ratio. Stress Images:  Normal homogeneous uptake in all areas of the myocardium. Rest Images:  Normal homogeneous uptake in all areas of the myocardium. Subtraction (SDS):  No evidence of ischemia. Transient Ischemic Dilatation (Normal <1.22):  0.99 Lung/Heart Ratio (Normal <0.45):  0.47  Quantitative Gated Spect Images QGS EDV:  118 ml QGS ESV:  51 ml  Impression Exercise Capacity:  Fair exercise capacity. BP Response:  Normal blood pressure response. Clinical Symptoms:  Fatigue ECG Impression:  No significant ST segment change suggestive of ischemia. Comparison with Prior Nuclear Study: No images to compare  Overall Impression:  Normal stress nuclear study. There is no scar or ischemia. The patient has only fair exercise tolerance. This is a low risk scan.  LV Ejection Fraction: 56%.  LV Wall Motion:  Normal Wall Motion   Daryel November, MD

## 2014-12-24 ENCOUNTER — Other Ambulatory Visit: Payer: Self-pay | Admitting: Nurse Practitioner

## 2014-12-24 ENCOUNTER — Ambulatory Visit
Admission: RE | Admit: 2014-12-24 | Discharge: 2014-12-24 | Disposition: A | Discharge status: Home / Self Care | Payer: Medicare Other | Source: Ambulatory Visit | Attending: Nurse Practitioner | Admitting: Nurse Practitioner

## 2014-12-24 DIAGNOSIS — M79671 Pain in right foot: Secondary | ICD-10-CM

## 2015-05-13 ENCOUNTER — Other Ambulatory Visit: Payer: Self-pay | Admitting: Internal Medicine

## 2015-05-13 ENCOUNTER — Ambulatory Visit
Admission: RE | Admit: 2015-05-13 | Discharge: 2015-05-13 | Disposition: A | Discharge status: Home / Self Care | Payer: Medicare Other | Source: Ambulatory Visit | Attending: Internal Medicine | Admitting: Internal Medicine

## 2015-05-13 DIAGNOSIS — M549 Dorsalgia, unspecified: Secondary | ICD-10-CM

## 2016-10-21 ENCOUNTER — Other Ambulatory Visit: Payer: Self-pay | Admitting: Gastroenterology

## 2016-12-31 ENCOUNTER — Encounter (HOSPITAL_COMMUNITY): Payer: Self-pay | Admitting: *Deleted

## 2017-01-03 ENCOUNTER — Ambulatory Visit (HOSPITAL_COMMUNITY)
Admission: RE | Admit: 2017-01-03 | Discharge: 2017-01-03 | Disposition: A | Discharge status: Home / Self Care | Payer: Medicare Other | Source: Ambulatory Visit | Attending: Gastroenterology | Admitting: Gastroenterology

## 2017-01-03 ENCOUNTER — Encounter (HOSPITAL_COMMUNITY)
Admission: RE | Disposition: A | Discharge status: Home / Self Care | Payer: Self-pay | Source: Ambulatory Visit | Attending: Gastroenterology

## 2017-01-03 ENCOUNTER — Ambulatory Visit (HOSPITAL_COMMUNITY): Payer: Medicare Other | Admitting: Anesthesiology

## 2017-01-03 ENCOUNTER — Encounter (HOSPITAL_COMMUNITY): Payer: Self-pay

## 2017-01-03 DIAGNOSIS — Z87891 Personal history of nicotine dependence: Secondary | ICD-10-CM | POA: Insufficient documentation

## 2017-01-03 DIAGNOSIS — Z1211 Encounter for screening for malignant neoplasm of colon: Secondary | ICD-10-CM | POA: Diagnosis not present

## 2017-01-03 DIAGNOSIS — G4733 Obstructive sleep apnea (adult) (pediatric): Secondary | ICD-10-CM | POA: Diagnosis not present

## 2017-01-03 DIAGNOSIS — Z79899 Other long term (current) drug therapy: Secondary | ICD-10-CM | POA: Diagnosis not present

## 2017-01-03 DIAGNOSIS — Z7984 Long term (current) use of oral hypoglycemic drugs: Secondary | ICD-10-CM | POA: Diagnosis not present

## 2017-01-03 DIAGNOSIS — Z86010 Personal history of colonic polyps: Secondary | ICD-10-CM | POA: Insufficient documentation

## 2017-01-03 DIAGNOSIS — I10 Essential (primary) hypertension: Secondary | ICD-10-CM | POA: Insufficient documentation

## 2017-01-03 DIAGNOSIS — G4730 Sleep apnea, unspecified: Secondary | ICD-10-CM | POA: Insufficient documentation

## 2017-01-03 DIAGNOSIS — E785 Hyperlipidemia, unspecified: Secondary | ICD-10-CM | POA: Insufficient documentation

## 2017-01-03 DIAGNOSIS — E1151 Type 2 diabetes mellitus with diabetic peripheral angiopathy without gangrene: Secondary | ICD-10-CM | POA: Diagnosis not present

## 2017-01-03 DIAGNOSIS — E7800 Pure hypercholesterolemia, unspecified: Secondary | ICD-10-CM | POA: Insufficient documentation

## 2017-01-03 HISTORY — PX: COLONOSCOPY WITH PROPOFOL: SHX5780

## 2017-01-03 LAB — GLUCOSE, CAPILLARY: Glucose-Capillary: 117 mg/dL — ABNORMAL HIGH (ref 65–99)

## 2017-01-03 SURGERY — COLONOSCOPY WITH PROPOFOL
Anesthesia: Monitor Anesthesia Care

## 2017-01-03 MED ORDER — LIDOCAINE 2% (20 MG/ML) 5 ML SYRINGE
INTRAMUSCULAR | Status: DC | PRN
  Administered 2017-01-03: 60 mg via INTRAVENOUS

## 2017-01-03 MED ORDER — LIDOCAINE 2% (20 MG/ML) 5 ML SYRINGE
INTRAMUSCULAR | Status: AC
  Filled 2017-01-03: qty 5

## 2017-01-03 MED ORDER — PROPOFOL 10 MG/ML IV BOLUS
INTRAVENOUS | Status: DC | PRN
  Administered 2017-01-03: 30 mg via INTRAVENOUS
  Administered 2017-01-03 (×2): 20 mg via INTRAVENOUS
  Administered 2017-01-03: 30 mg via INTRAVENOUS
  Administered 2017-01-03 (×5): 20 mg via INTRAVENOUS
  Administered 2017-01-03: 10 mg via INTRAVENOUS

## 2017-01-03 MED ORDER — PROPOFOL 10 MG/ML IV BOLUS
INTRAVENOUS | Status: AC
  Filled 2017-01-03: qty 40

## 2017-01-03 MED ORDER — LACTATED RINGERS IV SOLN
INTRAVENOUS | Status: DC
  Administered 2017-01-03: 11:00:00 via INTRAVENOUS

## 2017-01-03 MED ORDER — SODIUM CHLORIDE 0.9 % IV SOLN: INTRAVENOUS | Status: DC

## 2017-01-03 SURGICAL SUPPLY — 21 items

## 2017-01-03 NOTE — Op Note (Signed)
[facility] Patient Name: Jason Beard Procedure Date: 01/03/2017 MRN: [id number] Attending MD: Garlan Fair , MD Date of Birth: 1945-11-02 CSN: [id number] Age: 71 Admit Type: Outpatient Procedure:                Colonoscopy Indications:              Screening for colorectal malignant neoplasm.                            10/12/2011 normal surveillance colonoscopy was                            performed. 06/01/2006 normal surveillance                            colonoscopy was performed. 05/24/2003 screening                            colonoscopy was performed with removal of a 5 mm                            adenomatous transverse colon polyp. Providers:                Garlan Fair, MD, Kingsley Plan, RN, Lillie Fragmin, RN, Tinnie Gens, Technician Referring MD:              Medicines:                Propofol per Anesthesia Complications:            No immediate complications. Estimated Blood Loss:     Estimated blood loss: none. Procedure:                Pre-Anesthesia Assessment:                           - Prior to the procedure, a History and Physical                            was performed, and patient medications and                            allergies were reviewed. The patient's tolerance of                            previous anesthesia was also reviewed. The risks                            and benefits of the procedure and the sedation                            options and risks were discussed with the patient.                            All questions were answered,  and informed consent                            was obtained. Prior Anticoagulants: The patient has                            taken aspirin, last dose was 1 day prior to                            procedure. ASA Grade Assessment: II - A patient                            with mild systemic disease. After reviewing the   risks and benefits, the patient was deemed in                            satisfactory condition to undergo the procedure.                           After obtaining informed consent, the colonoscope                            was passed under direct vision. Throughout the                            procedure, the patient's blood pressure, pulse, and                            oxygen saturations were monitored continuously. The                            EC-[id number] ([id number]) scope was introduced through                            the anus and advanced to the the cecum, identified                            by appendiceal orifice and ileocecal valve. The                            colonoscopy was performed without difficulty. The                            patient tolerated the procedure well. The quality                            of the bowel preparation was good. The terminal                            ileum, the ileocecal valve, the appendiceal orifice                            and the rectum were photographed. Scope In: 11:41:25 AM Scope Out: 12:01:37 PM Scope Withdrawal Time:  0 hours 12 minutes 26 seconds  Total Procedure Duration: 0 hours 20 minutes 12 seconds  Findings:      The perianal and digital rectal examinations were normal.      The entire examined colon appeared normal. Impression:               - The entire examined colon is normal.                           - No specimens collected. Moderate Sedation:      N/A- Per Anesthesia Care Recommendation:           - Patient has a contact number available for                            emergencies. The signs and symptoms of potential                            delayed complications were discussed with the                            patient. Return to normal activities tomorrow.                            Written discharge instructions were provided to the                            patient.                           - Repeat  colonoscopy is not recommended for                            surveillance.                           - Resume previous diet.                           - Continue present medications. Procedure Code(s):        --- Professional ---                           O8416, Colorectal cancer screening; colonoscopy on                            individual not meeting criteria for high risk Diagnosis Code(s):        --- Professional ---                           Z12.11, Encounter for screening for malignant                            neoplasm of colon CPT copyright 2016 [employer]. All rights reserved. The codes documented in this report are preliminary and upon coder review may  be revised to meet current compliance requirements. Earle Gell, MD Garlan Fair, MD 01/03/2017 12:08:49 PM  This report has been signed electronically. Number of Addenda: 0

## 2017-01-03 NOTE — Anesthesia Preprocedure Evaluation (Addendum)
Anesthesia Evaluation  Patient identified by MRN, date of birth, ID band Patient awake    Reviewed: Allergy & Precautions, NPO status , Patient's Chart, lab work & pertinent test results  Airway Mallampati: II       Dental  (+) Chipped,    Pulmonary asthma , sleep apnea , former smoker,    Pulmonary exam normal        Cardiovascular hypertension, Pt. on medications + Peripheral Vascular Disease  + dysrhythmias Atrial Fibrillation  Rhythm:Regular Rate:Normal     Neuro/Psych PSYCHIATRIC DISORDERS Depression    GI/Hepatic Neg liver ROS, GERD  ,  Endo/Other  diabetes, Type 2, Oral Hypoglycemic Agents  Renal/GU negative Renal ROS  negative genitourinary   Musculoskeletal  (+) Arthritis , Osteoarthritis,    Abdominal   Peds negative pediatric ROS (+)  Hematology negative hematology ROS (+)   Anesthesia Other Findings - HLD  Reproductive/Obstetrics negative OB ROS                            Anesthesia Physical Anesthesia Plan  ASA: III  Anesthesia Plan: MAC   Post-op Pain Management:    Induction: Intravenous  PONV Risk Score and Plan: 0 and Propofol  Airway Management Planned: Natural Airway and Nasal Cannula  Additional Equipment:   Intra-op Plan:   Post-operative Plan:   Informed Consent: I have reviewed the patients History and Physical, chart, labs and discussed the procedure including the risks, benefits and alternatives for the proposed anesthesia with the patient or authorized representative who has indicated his/her understanding and acceptance.     Plan Discussed with: CRNA  Anesthesia Plan Comments:         Anesthesia Quick Evaluation

## 2017-01-03 NOTE — Discharge Instructions (Signed)

## 2017-01-03 NOTE — Anesthesia Postprocedure Evaluation (Signed)
Anesthesia Post Note  Patient: Jason Beard  Procedure(s) Performed: Procedure(s) (LRB): COLONOSCOPY WITH PROPOFOL (N/A)     Patient location during evaluation: PACU Anesthesia Type: MAC Level of consciousness: awake and alert Pain management: pain level controlled Vital Signs Assessment: post-procedure vital signs reviewed and stable Respiratory status: spontaneous breathing, nonlabored ventilation, respiratory function stable and patient connected to nasal cannula oxygen Cardiovascular status: stable and blood pressure returned to baseline Anesthetic complications: no    Last Vitals:  Vitals:   01/03/17 1210 01/03/17 1220  BP: 102/63 (!) 125/94  Pulse: (!) 50 (!) 47  Resp: 12 17  Temp:      Last Pain:  Vitals:   01/03/17 1017  TempSrc: Oral                 Effie Berkshire

## 2017-01-03 NOTE — H&P (Signed)
Procedure: Surveillance colonoscopy. 10/12/2011 normal surveillance colonoscopy was performed. History of adenomatous colon polyps removed colonoscopically in the past  History: The patient is a 71 year old male born 12/12/1945. He is scheduled to undergo a surveillance colonoscopy today  Past medical history: Hypercholesterolemia. Gastroesophageal reflux. Osteoarthritis. Right kidney cyst. Fatty appearing liver on CT scan performed in 2005. Type 2 diabetes mellitus. Obstructive sleep apnea. Cardiac ablation for atrial flutter. Varicose vein surgery.  Medication allergies: Penicillin  Exam: The patient is alert and lying comfortably on the endoscopy stretcher. Abdomen is soft and nontender to palpation. Lungs are clear to auscultation. Cardiac exam reveals a regular rhythm.  Plan: Proceed with surveillance colonoscopy

## 2017-01-03 NOTE — Transfer of Care (Signed)
Immediate Anesthesia Transfer of Care Note  Patient: Jason Beard  Procedure(s) Performed: Procedure(s) with comments: COLONOSCOPY WITH PROPOFOL (N/A) - [phone]/[id number]  Patient Location: PACU  Anesthesia Type:MAC  Level of Consciousness:  sedated, patient cooperative and responds to stimulation  Airway & Oxygen Therapy:Patient Spontanous Breathing and Patient connected to face mask oxgen  Post-op Assessment:  Report given to PACU RN and Post -op Vital signs reviewed and stable  Post vital signs:  Reviewed and stable  Last Vitals:  Vitals:   01/03/17 1017  BP: 133/71  Pulse: (!) 50  Resp: 16  Temp: 20.6 C    Complications: No apparent anesthesia complications

## 2017-01-05 ENCOUNTER — Encounter (HOSPITAL_COMMUNITY): Payer: Self-pay | Admitting: Gastroenterology

## 2017-04-08 ENCOUNTER — Ambulatory Visit
Admission: RE | Admit: 2017-04-08 | Discharge: 2017-04-08 | Disposition: A | Discharge status: Home / Self Care | Payer: Medicare Other | Source: Ambulatory Visit | Attending: Internal Medicine | Admitting: Internal Medicine

## 2017-04-08 ENCOUNTER — Other Ambulatory Visit: Payer: Self-pay | Admitting: Internal Medicine

## 2017-04-08 DIAGNOSIS — R0789 Other chest pain: Secondary | ICD-10-CM

## 2017-05-13 ENCOUNTER — Other Ambulatory Visit: Payer: Self-pay | Admitting: Internal Medicine

## 2017-05-13 ENCOUNTER — Ambulatory Visit
Admission: RE | Admit: 2017-05-13 | Discharge: 2017-05-13 | Disposition: A | Discharge status: Home / Self Care | Payer: Medicare Other | Source: Ambulatory Visit | Attending: Internal Medicine | Admitting: Internal Medicine

## 2017-05-13 DIAGNOSIS — M25552 Pain in left hip: Principal | ICD-10-CM

## 2017-05-13 DIAGNOSIS — M25551 Pain in right hip: Secondary | ICD-10-CM

## 2017-05-13 DIAGNOSIS — M545 Low back pain: Secondary | ICD-10-CM

## 2017-12-23 IMAGING — DX DG HIP (WITH OR WITHOUT PELVIS) 3-4V BILAT
3 series · 3 of 3 positions shown · non-contrast
Comparison: 04/20/2012

CLINICAL DATA: Bilateral hip pain 2 weeks.  No injury.

EXAM:
DG HIP (WITH OR WITHOUT PELVIS) 3-4V BILAT

[dg hips bilat w or w/o pelvis 3-4 views (1 of 3)]
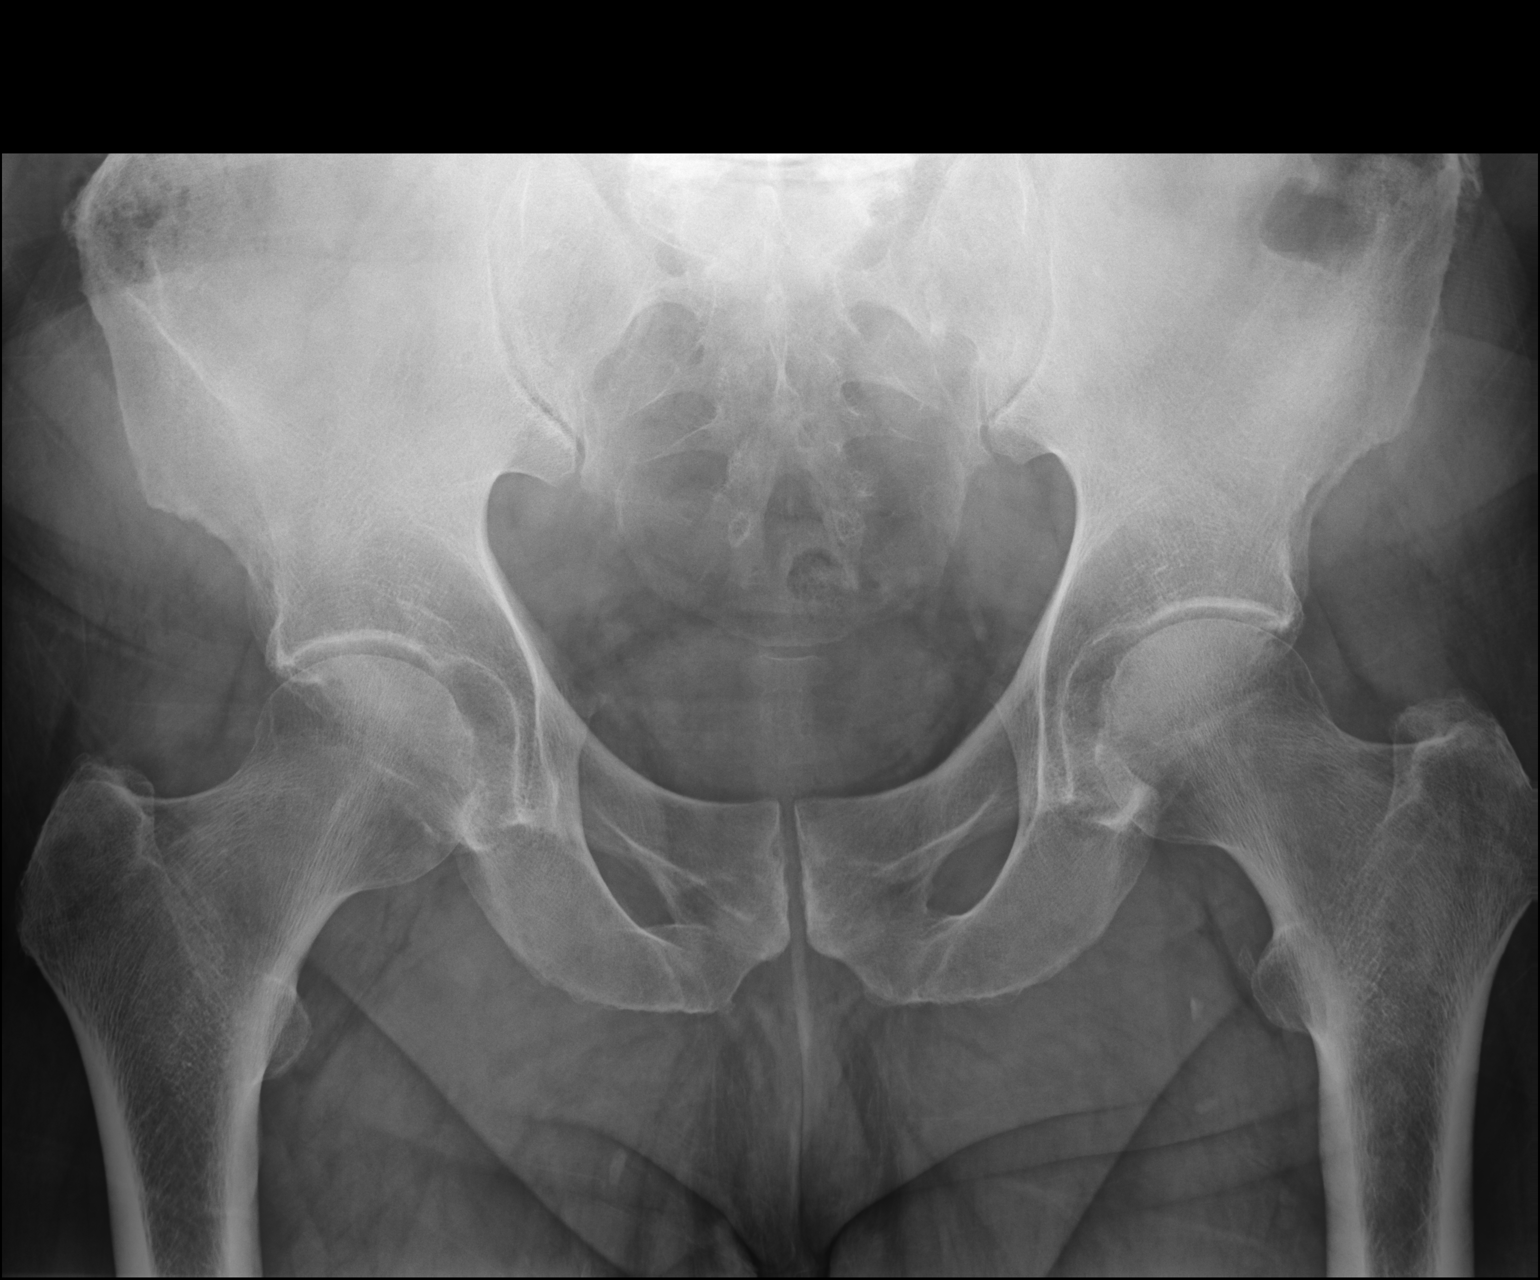

[dg hips bilat w or w/o pelvis 3-4 views (2 of 3)]
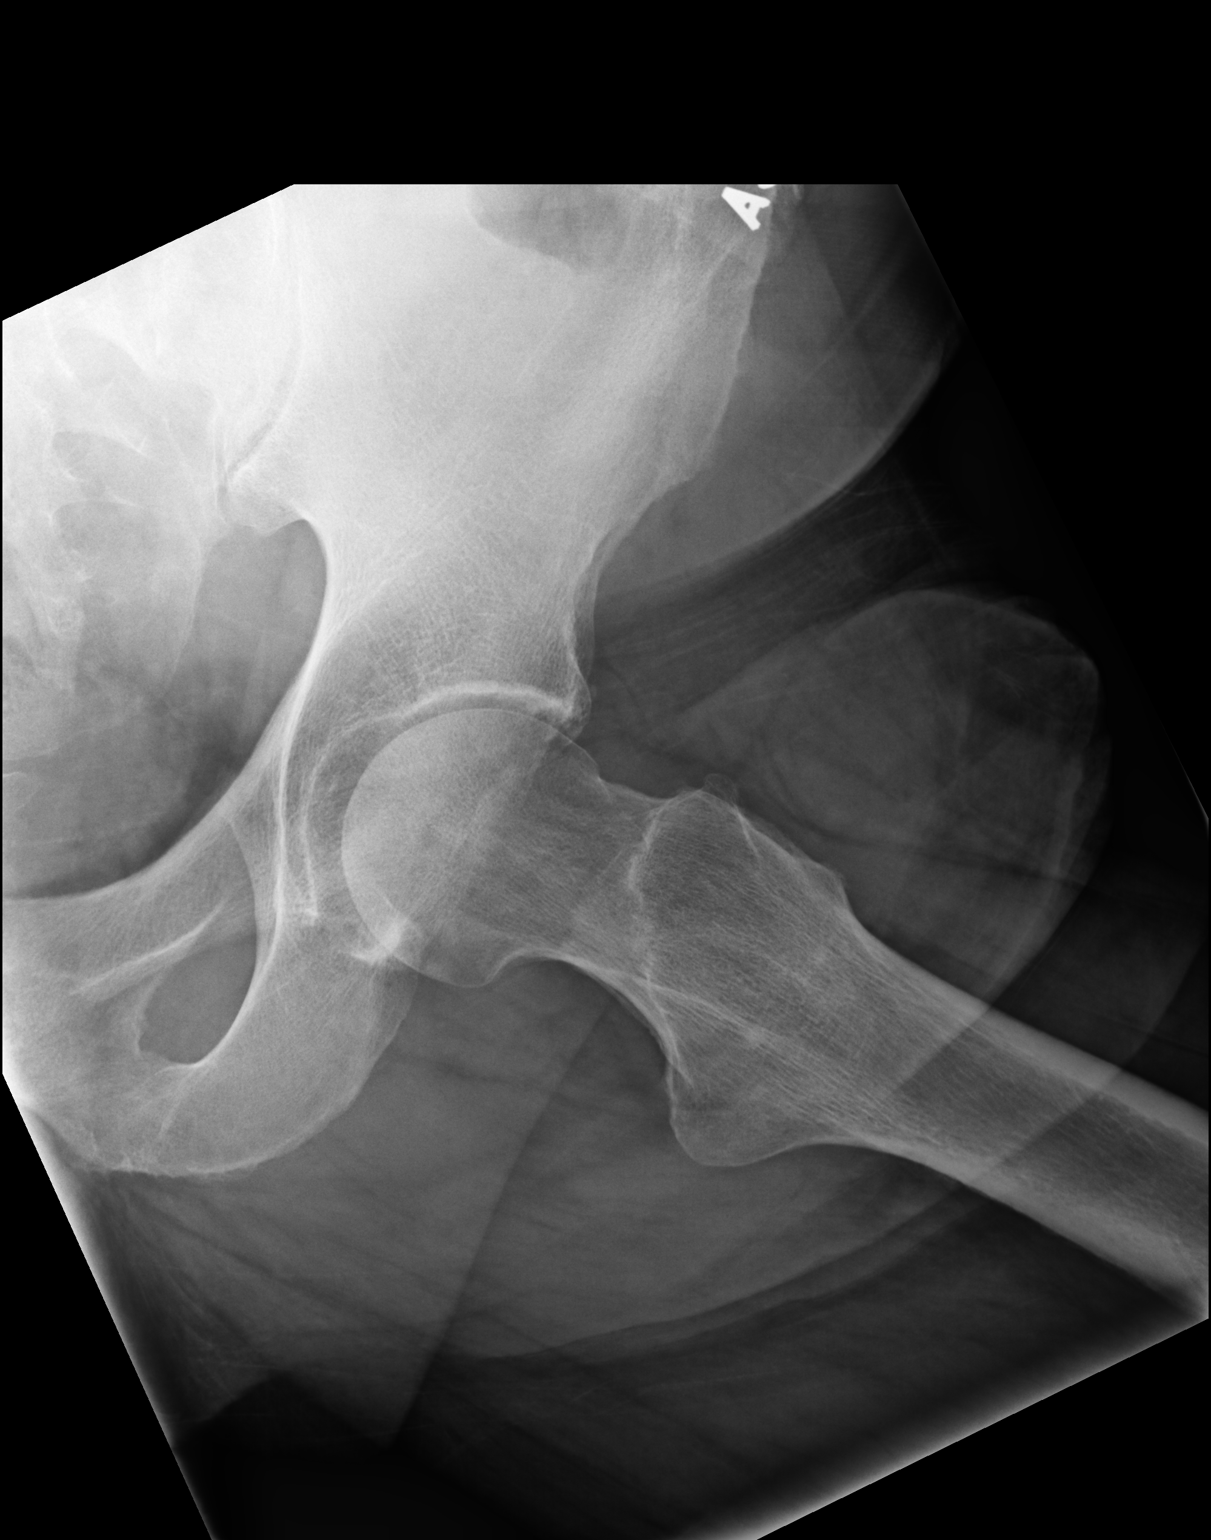

[dg hips bilat w or w/o pelvis 3-4 views (3 of 3)]
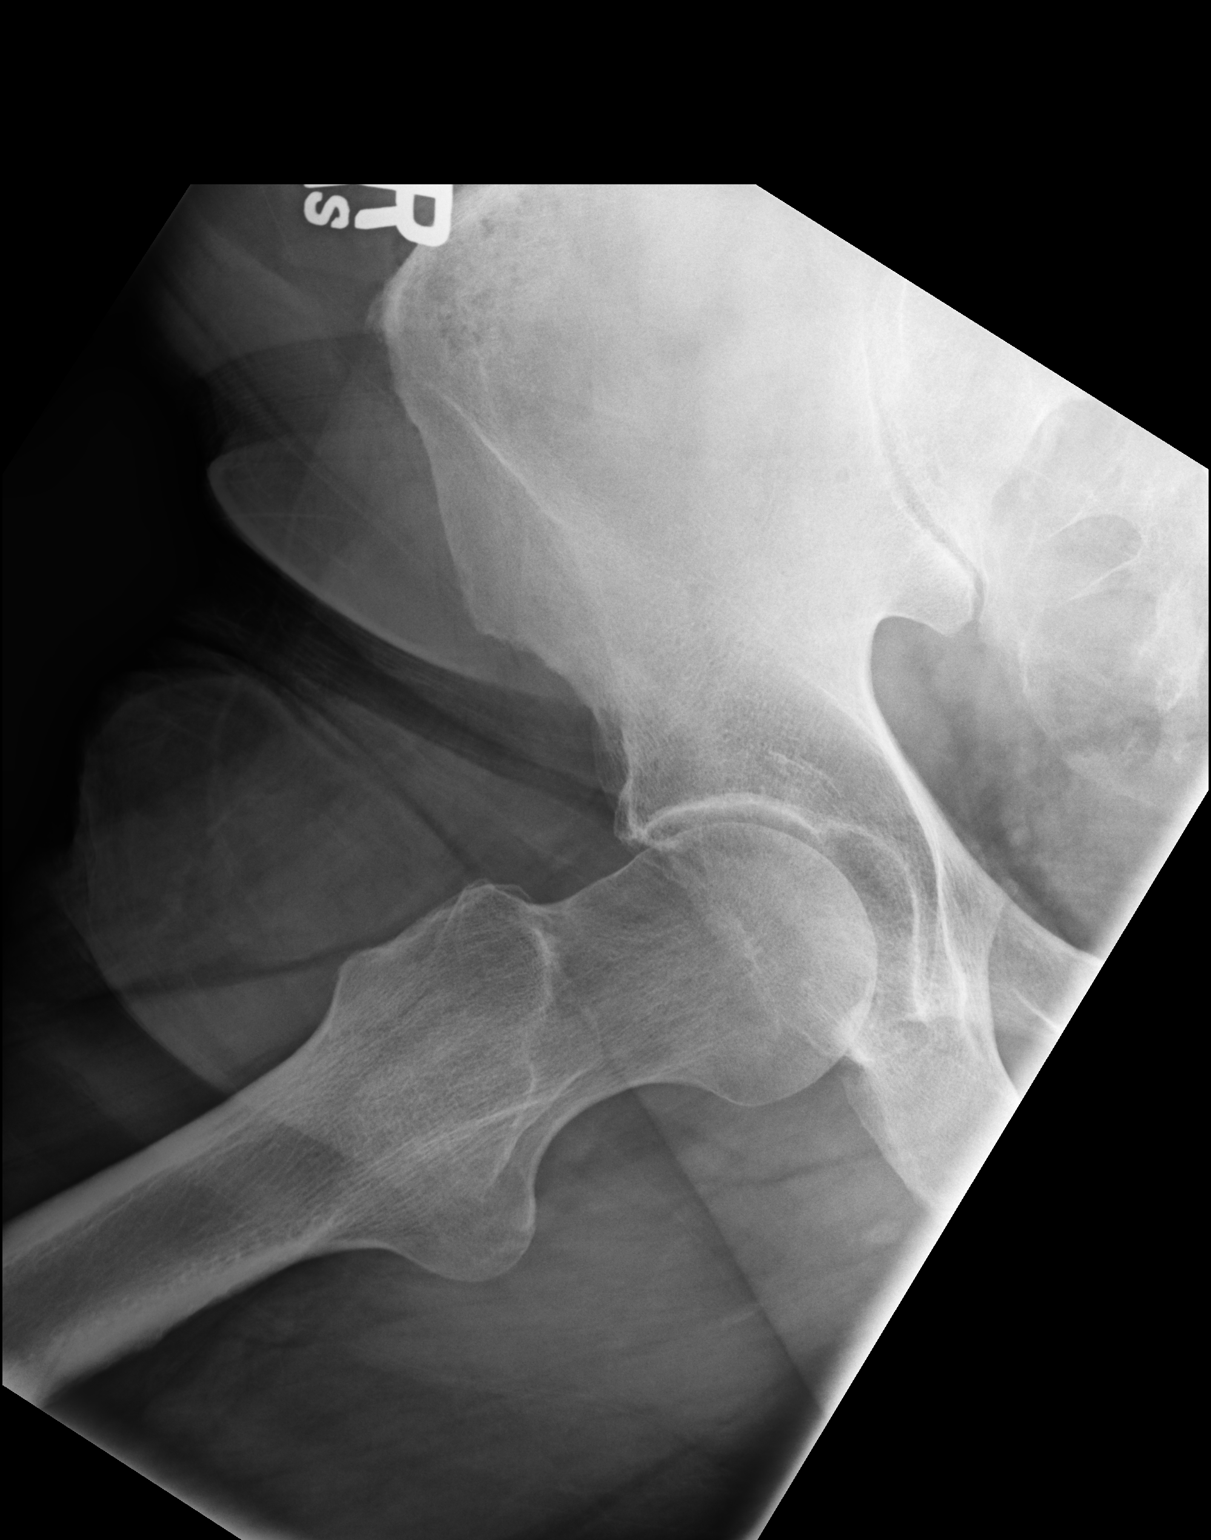

[3 of 3 positions shown; findings below may reference images not displayed]

FINDINGS: Mild symmetric degenerative change of the hips. No acute fracture or
dislocation. Degenerative change of the spine.
IMPRESSION: No acute findings.

Mild symmetric degenerative change of the hips.

## 2017-12-23 IMAGING — DX DG LUMBAR SPINE 2-3V
3 series · 3 of 3 positions shown · non-contrast
Comparison: MRI 05/30/2006

CLINICAL DATA: Low back pain radiating to both lower extremities 2
weeks. No injury.

EXAM:
LUMBAR SPINE - 2-3 VIEW

[dg lumbar spine 2-3 views (1 of 3)]
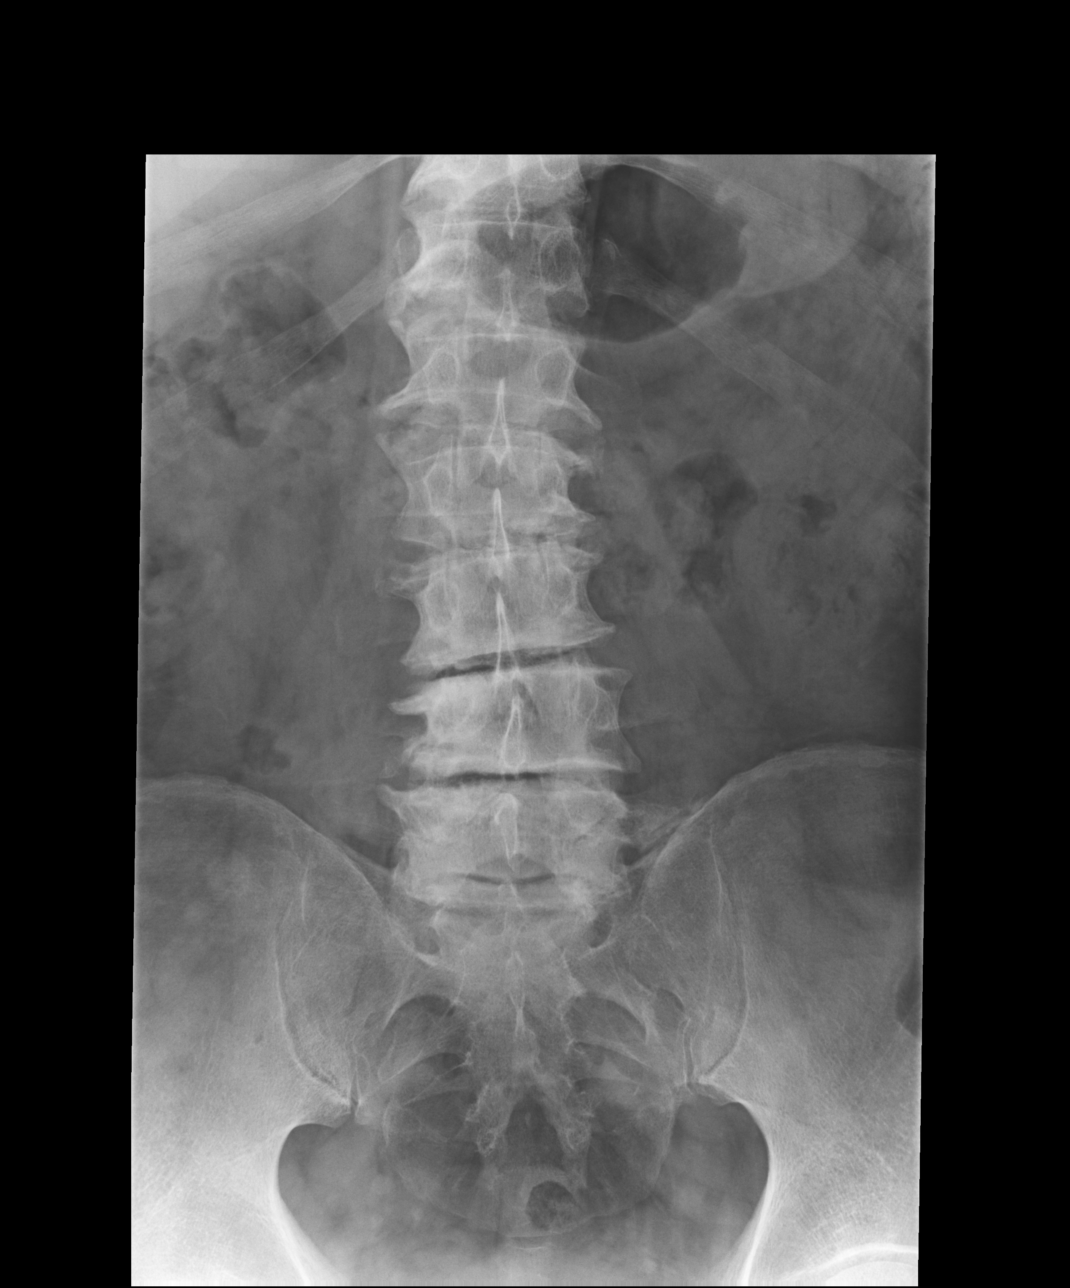

[dg lumbar spine 2-3 views (2 of 3)]
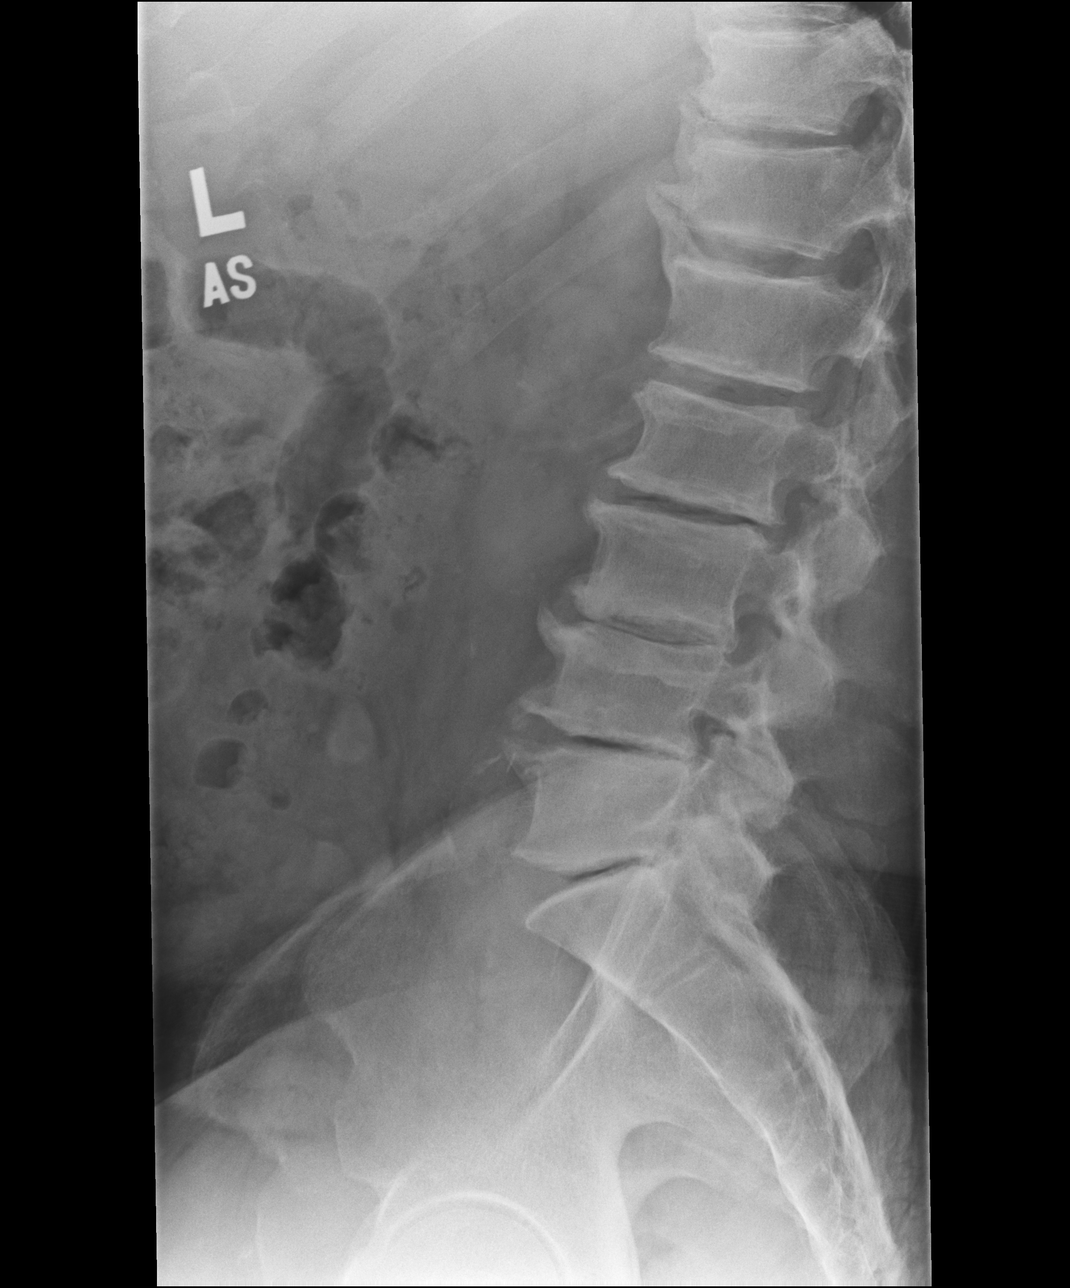

[dg lumbar spine 2-3 views (3 of 3)]
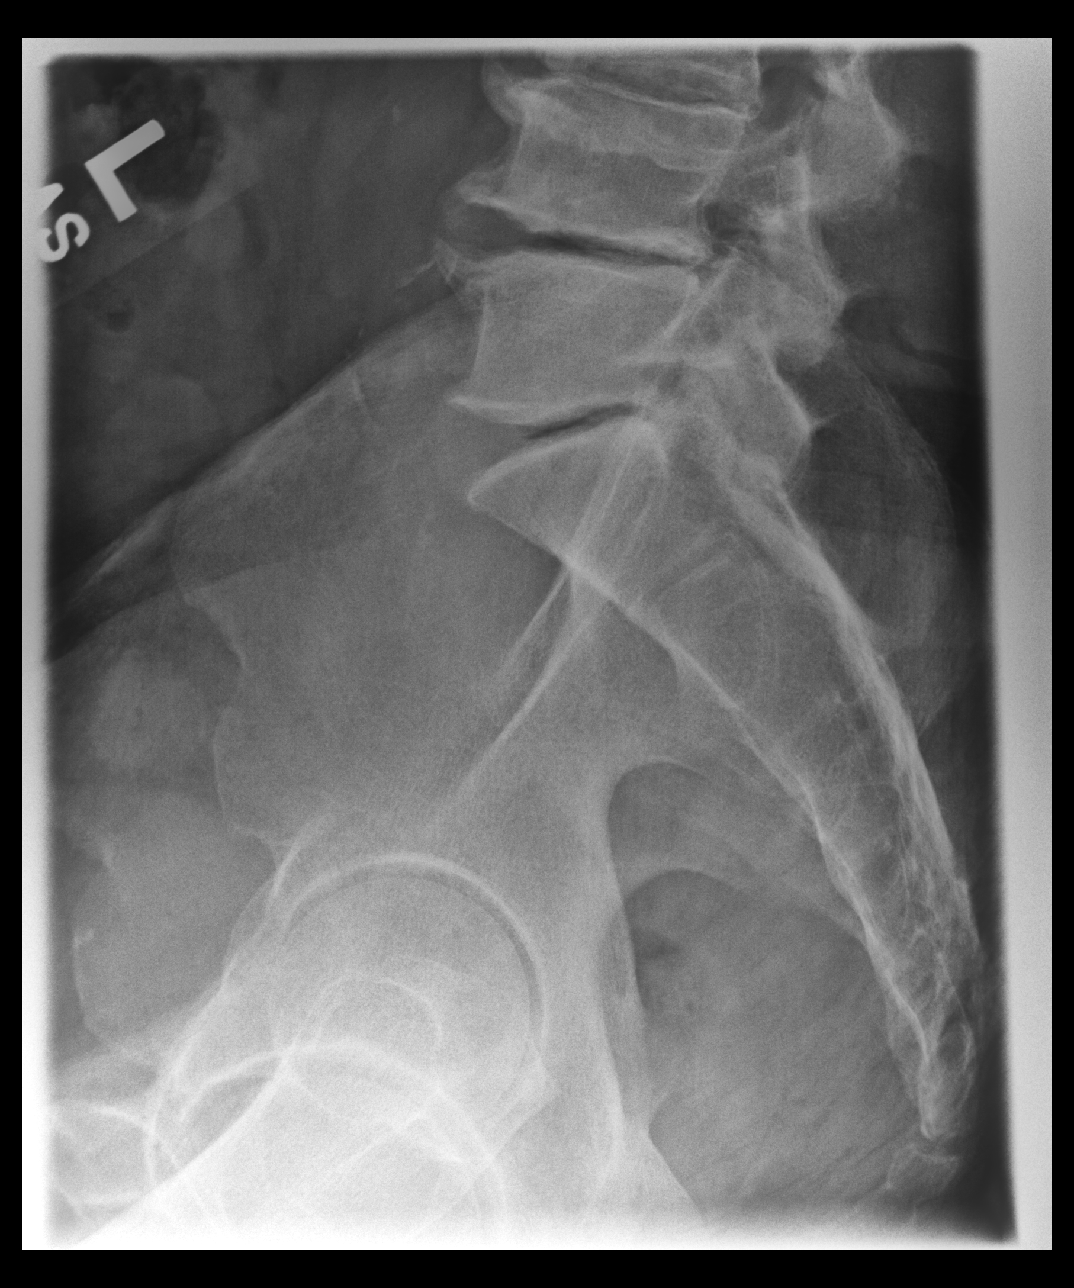

[3 of 3 positions shown; findings below may reference images not displayed]

FINDINGS: Vertebral body alignment and heights are within normal. There is
moderate spondylosis throughout the lumbar spine with disc space
narrowing worse from the L2-3 level through the L5-S1 level. Facet
arthropathy is present. There is no compression fracture or
subluxation.
IMPRESSION: No acute findings.

Moderate spondylosis of the lumbar spine with moderate multilevel
degenerative disc disease.

## 2018-08-11 ENCOUNTER — Other Ambulatory Visit: Payer: Self-pay

## 2018-08-11 ENCOUNTER — Emergency Department (HOSPITAL_BASED_OUTPATIENT_CLINIC_OR_DEPARTMENT_OTHER)
Admission: EM | Admit: 2018-08-11 | Discharge: 2018-08-11 | Disposition: A | Discharge status: Home / Self Care | Payer: Medicare Other | Attending: Emergency Medicine | Admitting: Emergency Medicine

## 2018-08-11 ENCOUNTER — Emergency Department (HOSPITAL_BASED_OUTPATIENT_CLINIC_OR_DEPARTMENT_OTHER): Payer: Medicare Other

## 2018-08-11 ENCOUNTER — Encounter (HOSPITAL_BASED_OUTPATIENT_CLINIC_OR_DEPARTMENT_OTHER): Payer: Self-pay | Admitting: *Deleted

## 2018-08-11 DIAGNOSIS — Z79899 Other long term (current) drug therapy: Secondary | ICD-10-CM | POA: Diagnosis not present

## 2018-08-11 DIAGNOSIS — Z87891 Personal history of nicotine dependence: Secondary | ICD-10-CM | POA: Diagnosis not present

## 2018-08-11 DIAGNOSIS — Z7984 Long term (current) use of oral hypoglycemic drugs: Secondary | ICD-10-CM | POA: Insufficient documentation

## 2018-08-11 DIAGNOSIS — I10 Essential (primary) hypertension: Secondary | ICD-10-CM | POA: Insufficient documentation

## 2018-08-11 DIAGNOSIS — J45909 Unspecified asthma, uncomplicated: Secondary | ICD-10-CM | POA: Insufficient documentation

## 2018-08-11 DIAGNOSIS — E119 Type 2 diabetes mellitus without complications: Secondary | ICD-10-CM | POA: Insufficient documentation

## 2018-08-11 DIAGNOSIS — N201 Calculus of ureter: Secondary | ICD-10-CM

## 2018-08-11 DIAGNOSIS — R1032 Left lower quadrant pain: Secondary | ICD-10-CM | POA: Diagnosis present

## 2018-08-11 LAB — URINALYSIS, ROUTINE W REFLEX MICROSCOPIC
Bilirubin Urine: NEGATIVE
Glucose, UA: NEGATIVE mg/dL
Ketones, ur: NEGATIVE mg/dL
Leukocytes, UA: NEGATIVE
Nitrite: NEGATIVE
Protein, ur: NEGATIVE mg/dL
Specific Gravity, Urine: >1.03 — ABNORMAL HIGH (ref 1.005–1.030)
pH: 5 (ref 5.0–8.0)

## 2018-08-11 LAB — COMPREHENSIVE METABOLIC PANEL
ALT: 27 U/L (ref 0–44)
AST: 23 U/L (ref 15–41)
Albumin: 4.4 g/dL (ref 3.5–5.0)
Alkaline Phosphatase: 63 U/L (ref 38–126)
Anion gap: 8 (ref 5–15)
BUN: 26 mg/dL — ABNORMAL HIGH (ref 8–23)
CO2: 25 mmol/L (ref 22–32)
Calcium: 9.6 mg/dL (ref 8.9–10.3)
Chloride: 103 mmol/L (ref 98–111)
Creatinine, Ser: 1.51 mg/dL — ABNORMAL HIGH (ref 0.61–1.24)
GFR calc Af Amer: 52 mL/min — ABNORMAL LOW (ref >60)
GFR calc non Af Amer: 45 mL/min — ABNORMAL LOW (ref >60)
Glucose, Bld: 152 mg/dL — ABNORMAL HIGH (ref 70–99)
Potassium: 4.6 mmol/L (ref 3.5–5.1)
Sodium: 136 mmol/L (ref 135–145)
Total Bilirubin: 0.9 mg/dL (ref 0.3–1.2)
Total Protein: 7.8 g/dL (ref 6.5–8.1)

## 2018-08-11 LAB — CBC
HCT: 48.3 % (ref 39.0–52.0)
Hemoglobin: 15.8 g/dL (ref 13.0–17.0)
MCH: 28.6 pg (ref 26.0–34.0)
MCHC: 32.7 g/dL (ref 30.0–36.0)
MCV: 87.3 fL (ref 80.0–100.0)
Platelets: 192 10*3/uL (ref 150–400)
RBC: 5.53 MIL/uL (ref 4.22–5.81)
RDW: 13.2 % (ref 11.5–15.5)
WBC: 14.5 10*3/uL — ABNORMAL HIGH (ref 4.0–10.5)
nRBC: 0 % (ref 0.0–0.2)

## 2018-08-11 LAB — URINALYSIS, MICROSCOPIC (REFLEX): Bacteria, UA: NONE SEEN

## 2018-08-11 LAB — TROPONIN I: Troponin I: <0.03 ng/mL (ref <0.03)

## 2018-08-11 LAB — LIPASE, BLOOD: Lipase: 31 U/L (ref 11–51)

## 2018-08-11 MED ORDER — SODIUM CHLORIDE 0.9 % IV BOLUS
1000.0000 mL | Freq: Once | INTRAVENOUS | Status: AC
  Administered 2018-08-11: 1000 mL via INTRAVENOUS

## 2018-08-11 MED ORDER — SODIUM CHLORIDE 0.9% FLUSH
3.0000 mL | Freq: Once | INTRAVENOUS | Status: DC
  Filled 2018-08-11: qty 3

## 2018-08-11 NOTE — ED Provider Notes (Signed)
[facility] EMERGENCY DEPARTMENT Provider Note   CSN: [id number] Arrival date & time: 08/11/18  1754     History   Chief Complaint Chief Complaint  Patient presents with  . Abdominal Pain    HPI Jason Beard is a 73 y.o. male.  HPI  73 year old male presents with acute left flank pain.  Started around 11:30 AM after getting home from the gym.  Pain was severe for about 4-1/2 hours.  He went to his doctor's office and was waiting for an open spot and it seemed to go away.  He did urinate and give a specimen for them and they told him there was large amounts of blood.  He has not had any dysuria but while the pain was ongoing he had some trouble urinating and clearing his bladder.  The pain never seem to radiate.  There is no chest pain or shortness of breath.  No fevers.  Currently he is completely asymptomatic and never took anything for this.  Past Medical History:  Diagnosis Date  . Arthritis    DJD  . Asthma   . Atrial flutter ([place])    a. s/p ablation 12/2012.  . Depression   . Diabetes mellitus   . Diverticulosis   . Essential hypertension 01/29/2013  . GERD (gastroesophageal reflux disease)   . History of hemorrhoids   . History of simple renal cyst   . Hyperlipidemia   . Nocturia   . OSA (obstructive sleep apnea)   . Varicose veins    Left Leg    Patient Active Problem List   Diagnosis Date Noted  . Overweight 08/20/2014  . Diabetes mellitus ([place]) 08/20/2014  . Hyperlipidemia 08/20/2014  . Essential hypertension 01/29/2013  . Atrial flutter ([place]) 11/30/2012  . Varicose veins of lower extremities with other complications 45/62/5638    Past Surgical History:  Procedure Laterality Date  . ATRIAL FLUTTER ABLATION N/A 12/28/2012   Procedure: ATRIAL FLUTTER ABLATION;  Surgeon: Deboraha Sprang, MD;  Location: [facility] CATH LAB;  Service: Cardiovascular;  Laterality: N/A;  . COLONOSCOPY WITH PROPOFOL N/A 01/03/2017   Procedure: COLONOSCOPY WITH PROPOFOL;   Surgeon: Garlan Fair, MD;  Location: [facility] ENDOSCOPY;  Service: Endoscopy;  Laterality: N/A;  [phone]        Home Medications    Prior to Admission medications   Medication Sig Start Date End Date Taking? Authorizing Provider  Glucosamine 500 MG TABS Take 1 tablet by mouth daily.    Yes [provider]  lisinopril (PRINIVIL,ZESTRIL) 2.5 MG tablet Take 2.5 mg by mouth daily. 07/29/14  Yes [provider]  metFORMIN (GLUCOPHAGE) 500 MG tablet Take 500 mg by mouth daily with breakfast.    Yes [provider]  Omega-3 Fatty Acids (FISH OIL) 1000 MG CAPS Take 1,000 mg by mouth daily.    Yes [provider]  pravastatin (PRAVACHOL) 20 MG tablet Take 20 mg by mouth at bedtime.   Yes [provider]  zolpidem (AMBIEN) 10 MG tablet Take 5-10 mg by mouth at bedtime.    Yes [provider]  Coenzyme Q10 (COQ10) 200 MG CAPS Take 200 mg by mouth daily.     [provider]  Multiple Vitamins-Minerals (MULTIVITAMIN WITH MINERALS) tablet Take 1 tablet by mouth daily.    [provider]  tamsulosin (FLOMAX) 0.4 MG CAPS capsule Take 0.4 mg by mouth at bedtime.    [provider]    Family History Family History  Problem Relation Age of Onset  . Cancer Mother   . Heart disease Father   . Cancer Sister     Social History Social History   Tobacco Use  . Smoking status: Former Smoker    Years: 30.00    Types: Cigarettes    Last attempt to quit: 07/19/1984    Years since quitting: 34.0  . Smokeless tobacco: Former Systems developer    Quit date: 10/24/1961  Substance Use Topics  . Alcohol use: Yes    Alcohol/week: 1.0 standard drinks    Types: 1 Glasses of wine per week  . Drug use: No     Allergies   Ampicillin   Review of Systems Review of Systems  Respiratory: Negative for shortness of breath.   Cardiovascular: Negative for chest pain.  Gastrointestinal: Negative for abdominal pain, diarrhea and  vomiting.  Genitourinary: Positive for difficulty urinating and flank pain. Negative for dysuria and hematuria.  All other systems reviewed and are negative.    Physical Exam Updated Vital Signs BP (!) 152/84   Pulse 70   Temp 98.5 F (36.9 C) (Oral)   Resp 16   Ht 6\' 1"  (1.854 m)   Wt 113.4 kg   SpO2 99%   BMI 32.98 kg/m   Physical Exam Vitals signs and nursing note reviewed.  Constitutional:      General: He is not in acute distress.    Appearance: He is well-developed. He is obese. He is not ill-appearing or diaphoretic.  HENT:     Head: Normocephalic and atraumatic.     Right Ear: External ear normal.     Left Ear: External ear normal.     Nose: Nose normal.  Eyes:     General:        Right eye: No discharge.        Left eye: No discharge.  Neck:     Musculoskeletal: Neck supple.  Cardiovascular:     Rate and Rhythm: Normal rate and regular rhythm.     Heart sounds: Normal heart sounds.  Pulmonary:     Effort: Pulmonary effort is normal.     Breath sounds: Normal breath sounds.  Abdominal:     Palpations: Abdomen is soft.     Tenderness: There is no abdominal tenderness. There is no right CVA tenderness or left CVA tenderness.  Skin:    General: Skin is warm and dry.  Neurological:     Mental Status: He is alert.  Psychiatric:        Mood and Affect: Mood is not anxious.      ED Treatments / Results  Labs (all labs ordered are listed, but only abnormal results are displayed) Labs Reviewed  COMPREHENSIVE METABOLIC PANEL - Abnormal; Notable for the following components:      Result Value   Glucose, Bld 152 (*)    BUN 26 (*)    Creatinine, Ser 1.51 (*)    GFR calc non Af Amer 45 (*)    GFR calc Af Amer 52 (*)    All other components within normal limits  CBC - Abnormal; Notable for the following components:   WBC 14.5 (*)    All other components within normal limits  URINALYSIS, ROUTINE W REFLEX MICROSCOPIC - Abnormal; Notable for the following  components:   Specific Gravity, Urine >1.030 (*)    Hgb urine dipstick LARGE (*)    All other components within normal limits  LIPASE, BLOOD  TROPONIN I  URINALYSIS, MICROSCOPIC (REFLEX)  EKG EKG Interpretation  Date/Time:  Friday August 11 2018 18:50:12 EST Ventricular Rate:  62 PR Interval:    QRS Duration: 105 QT Interval:  398 QTC Calculation: 405 R Axis:   -19 Text Interpretation:  Sinus rhythm Abnormal R-wave progression, early transition Left ventricular hypertrophy no acute ST/T changes No old tracing to compare Confirmed by Sherwood Gambler [phone]) on 08/11/2018 7:11:34 PM   Radiology Ct Renal Stone Study  Result Date: 08/11/2018 CLINICAL DATA:  Left-sided abdominal pain, hematuria EXAM: CT ABDOMEN AND PELVIS WITHOUT CONTRAST TECHNIQUE: Multidetector CT imaging of the abdomen and pelvis was performed following the standard protocol without IV contrast. COMPARISON:  None. FINDINGS: Lower chest: No acute abnormality. Hepatobiliary: No focal liver abnormality is seen. No gallstones, gallbladder wall thickening, or biliary dilatation. Pancreas: Unremarkable. No pancreatic ductal dilatation or surrounding inflammatory changes. Spleen: Normal in size without focal abnormality. Adrenals/Urinary Tract: Adrenal glands are within normal limits. The kidneys are well visualized bilaterally with renal cysts bilaterally. Fullness of the left collecting system and proximal left ureter is noted. This extends to the level of the ureterovesical junction at which point a 1-2 mm stone is noted causing the obstructive change. The bladder is decompressed. Stomach/Bowel: The appendix is within normal limits. Colon is predominately decompressed. No small bowel abnormality is seen. The stomach is within normal limits. Vascular/Lymphatic: Aortic atherosclerosis. No enlarged abdominal or pelvic lymph nodes. Reproductive: Prostate is unremarkable. Other: No abdominal wall hernia or abnormality. No  abdominopelvic ascites. Musculoskeletal: No acute or significant osseous findings. IMPRESSION: Tiny left UVJ stone with mild obstructive change. Bilateral renal cysts. Electronically Signed   By: Inez Catalina M.D.   On: 08/11/2018 18:51    Procedures Procedures (including critical care time)  Medications Ordered in ED Medications  sodium chloride flush (NS) 0.9 % injection 3 mL (3 mLs Intravenous Not Given 08/11/18 1822)  sodium chloride 0.9 % bolus 1,000 mL (1,000 mLs Intravenous New Bag/Given 08/11/18 1902)     Initial Impression / Assessment and Plan / ED Course  I have reviewed the triage vital signs and the nursing notes.  Pertinent labs & imaging results that were available during my care of the patient were reviewed by me and considered in my medical decision making (see chart for details).     CT confirms left ureteral stone.  The patient is currently asymptomatic.  We discussed good hydration as well as need for follow-up with PCP.  His creatinine is bumped compared to 5 years ago, but the clinical significance is unclear.  At this point, he will be given IV fluids but otherwise appears stable for outpatient follow-up with PCP.  Final Clinical Impressions(s) / ED Diagnoses   Final diagnoses:  Left ureteral stone    ED Discharge Orders    None       Sherwood Gambler, MD 08/11/18 [phone]

## 2018-08-11 NOTE — ED Triage Notes (Signed)
Abdominal pain. Pain is in his left lower quadrant. No hx of diverticulitis.

## 2019-08-03 ENCOUNTER — Ambulatory Visit: Payer: Medicare Other | Attending: Internal Medicine

## 2019-08-03 DIAGNOSIS — Z23 Encounter for immunization: Secondary | ICD-10-CM

## 2019-08-03 NOTE — Progress Notes (Signed)
   Covid-19 Vaccination Clinic  Name:  Jason Beard    MRN: [id number] DOB: 03-Jul-1946  08/03/2019  Jason Beard was observed post Covid-19 immunization for 15 minutes without incidence. He was provided with Vaccine Information Sheet and instruction to access the V-Safe system.   Jason Beard was instructed to call 911 with any severe reactions post vaccine: Marland Kitchen Difficulty breathing  . Swelling of your face and throat  . A fast heartbeat  . A bad rash all over your body  . Dizziness and weakness    Immunizations Administered    Name Date Dose VIS Date Route   Pfizer COVID-19 Vaccine 08/03/2019  9:29 AM 0.3 mL 06/29/2019 Intramuscular   Manufacturer: [employer]   Lot: [id number]   [place]: [id number]

## 2019-08-21 ENCOUNTER — Ambulatory Visit: Payer: Medicare Other | Attending: Internal Medicine

## 2019-08-21 ENCOUNTER — Ambulatory Visit: Payer: Medicare Other

## 2019-08-21 DIAGNOSIS — Z23 Encounter for immunization: Secondary | ICD-10-CM | POA: Insufficient documentation

## 2019-08-21 NOTE — Progress Notes (Signed)
   Covid-19 Vaccination Clinic  Name:  Jason Beard    MRN: [id number] DOB: 1946-01-12  08/21/2019  Mr. Jason Beard was observed post Covid-19 immunization for 15 minutes without incidence. He was provided with Vaccine Information Sheet and instruction to access the V-Safe system.   Mr. Jason Beard was instructed to call 911 with any severe reactions post vaccine: Marland Kitchen Difficulty breathing  . Swelling of your face and throat  . A fast heartbeat  . A bad rash all over your body  . Dizziness and weakness    Immunizations Administered    Name Date Dose VIS Date Route   Pfizer COVID-19 Vaccine 08/21/2019  8:17 AM 0.3 mL 06/29/2019 Intramuscular   Manufacturer: [place]   Lot: [id number]   [place]: [id number]

## 2019-09-18 ENCOUNTER — Ambulatory Visit: Payer: Medicare Other | Admitting: Orthopaedic Surgery

## 2019-09-20 ENCOUNTER — Ambulatory Visit: Payer: Medicare Other | Admitting: Orthopaedic Surgery

## 2019-09-20 ENCOUNTER — Other Ambulatory Visit: Payer: Self-pay

## 2019-09-20 ENCOUNTER — Ambulatory Visit: Payer: Self-pay

## 2019-09-20 DIAGNOSIS — M25551 Pain in right hip: Secondary | ICD-10-CM | POA: Diagnosis not present

## 2019-09-20 DIAGNOSIS — M7061 Trochanteric bursitis, right hip: Secondary | ICD-10-CM

## 2019-09-20 MED ORDER — LIDOCAINE HCL 1 % IJ SOLN
3.0000 mL | INTRAMUSCULAR | Status: AC | PRN
  Administered 2019-09-20: 3 mL

## 2019-09-20 MED ORDER — METHYLPREDNISOLONE ACETATE 40 MG/ML IJ SUSP
40.0000 mg | INTRAMUSCULAR | Status: AC | PRN
  Administered 2019-09-20: 40 mg via INTRA_ARTICULAR

## 2019-09-20 NOTE — Progress Notes (Signed)
Office Visit Note   Patient: Jason Beard           Date of Birth: [birth year]           MRN: [id number] Visit Date: 09/20/2019              Requested by: Wenda Low, MD [address] [place] [address] [place],  [place] [address] PCP: Wenda Low, MD   Assessment & Plan: Visit Diagnoses:  1. Pain in right hip   2. Trochanteric bursitis, right hip     Plan: His signs and symptoms are consistent with trochanteric bursitis of the right hip.  I explained what this means in detail.  I did recommend a steroid injection over the point of maximum tenderness and he agreed to try this.  He did tolerate it well.  I also recommended Voltaren gel and I showed him stretching exercises to try.  All question concerns were answered and addressed.  Follow-up will be as needed.  If this worsens in any way he can always come back to see [place] for repeat injection and we can always send her to physical therapy if needed.  Follow-Up Instructions: Return if symptoms worsen or fail to improve.   Orders:  Orders Placed This Encounter  Procedures  . Large Joint Inj  . XR HIP UNILAT W OR W/O PELVIS 1V RIGHT   No orders of the defined types were placed in this encounter.     Procedures: Large Joint Inj: R greater trochanter on 09/20/2019 1:53 PM Indications: pain and diagnostic evaluation Details: 22 G 1.5 in needle, lateral approach  Arthrogram: No  Medications: 3 mL lidocaine 1 %; 40 mg methylPREDNISolone acetate 40 MG/ML Outcome: tolerated well, no immediate complications Procedure, treatment alternatives, risks and benefits explained, specific risks discussed. Consent was given by the patient. Immediately prior to procedure a time out was called to verify the correct patient, procedure, equipment, support staff and site/side marked as required. Patient was prepped and draped in the usual sterile fashion.       Clinical Data: No additional findings.   Subjective: Chief Complaint    Patient presents with  . Right Hip - Pain  The patient is a very pleasant and active 74 year old gentleman who is an avid golfer.  He comes in with right hip pain that is worse when he has been sitting for a while driving in a gets out of car and is on the lateral aspect of his right hip that hurts.  He denies any groin pain.  He denies any back pain or radicular symptoms going down his right leg.  He denies any injuries.  He is not a diabetic or smoker.  This is been aggravating for 6 months to a year now.  He does not sleep on his side because he does have sleep apnea and is on a CPAP machine at night.  HPI  Review of Systems He currently denies any headache, chest pain, shortness of breath, fever, chills, nausea, vomiting  Objective: Vital Signs: There were no vitals taken for this visit.  Physical Exam He is alert and orient x3 and in no acute distress Ortho Exam Examination of both hips show he has fluid normal range of motion of both hips and the range of motion is full.  When I have him lay supine his leg lengths are equal.  He then rolled to his side and there is pain to palpation only over the trochanteric area and not even  over the IT band.  His knee and foot and ankle exam are normal. Specialty Comments:  No specialty comments available.  Imaging: XR HIP UNILAT W OR W/O PELVIS 1V RIGHT  Result Date: 09/20/2019 A low AP pelvis and lateral the right hip shows no significant arthritic changes in either hip.  The hip joint spaces are concentric and well aligned.  There is no acute findings around the trochanteric area of either hip.  There is noted degenerative changes in the lumbar spine.    PMFS History: Patient Active Problem List   Diagnosis Date Noted  . Overweight 08/20/2014  . Diabetes mellitus ([place]) 08/20/2014  . Hyperlipidemia 08/20/2014  . Essential hypertension 01/29/2013  . Atrial flutter ([place]) 11/30/2012  . Varicose veins of lower extremities with other  complications [id number]   Past Medical History:  Diagnosis Date  . Arthritis    DJD  . Asthma   . Atrial flutter ([place])    a. s/p ablation 12/2012.  . Depression   . Diabetes mellitus   . Diverticulosis   . Essential hypertension 01/29/2013  . GERD (gastroesophageal reflux disease)   . History of hemorrhoids   . History of simple renal cyst   . Hyperlipidemia   . Nocturia   . OSA (obstructive sleep apnea)   . Varicose veins    Left Leg    Family History  Problem Relation Age of Onset  . Cancer Mother   . Heart disease Father   . Cancer Sister     Past Surgical History:  Procedure Laterality Date  . ATRIAL FLUTTER ABLATION N/A 12/28/2012   Procedure: ATRIAL FLUTTER ABLATION;  Surgeon: Deboraha Sprang, MD;  Location: [facility] CATH LAB;  Service: Cardiovascular;  Laterality: N/A;  . COLONOSCOPY WITH PROPOFOL N/A 01/03/2017   Procedure: COLONOSCOPY WITH PROPOFOL;  Surgeon: Garlan Fair, MD;  Location: [facility] ENDOSCOPY;  Service: Endoscopy;  Laterality: N/A;  [phone]   Social History   Occupational History  . Occupation: retired  Tobacco Use  . Smoking status: Former Smoker    Years: 30.00    Types: Cigarettes    Quit date: 07/19/1984    Years since quitting: 35.1  . Smokeless tobacco: Former Systems developer    Quit date: 10/24/1961  Substance and Sexual Activity  . Alcohol use: Yes    Alcohol/week: 1.0 standard drinks    Types: 1 Glasses of wine per week  . Drug use: No  . Sexual activity: Not on file

## 2020-07-31 DIAGNOSIS — E119 Type 2 diabetes mellitus without complications: Secondary | ICD-10-CM | POA: Diagnosis not present

## 2020-07-31 DIAGNOSIS — H35372 Puckering of macula, left eye: Secondary | ICD-10-CM | POA: Diagnosis not present

## 2020-08-06 ENCOUNTER — Encounter (INDEPENDENT_AMBULATORY_CARE_PROVIDER_SITE_OTHER): Payer: Self-pay | Admitting: Ophthalmology

## 2020-08-06 DIAGNOSIS — L729 Follicular cyst of the skin and subcutaneous tissue, unspecified: Secondary | ICD-10-CM | POA: Diagnosis not present

## 2020-08-12 DIAGNOSIS — E7800 Pure hypercholesterolemia, unspecified: Secondary | ICD-10-CM | POA: Diagnosis not present

## 2020-08-12 DIAGNOSIS — G4733 Obstructive sleep apnea (adult) (pediatric): Secondary | ICD-10-CM | POA: Diagnosis not present

## 2020-08-12 DIAGNOSIS — E1169 Type 2 diabetes mellitus with other specified complication: Secondary | ICD-10-CM | POA: Diagnosis not present

## 2020-08-19 ENCOUNTER — Other Ambulatory Visit: Payer: Self-pay

## 2020-08-19 ENCOUNTER — Encounter (INDEPENDENT_AMBULATORY_CARE_PROVIDER_SITE_OTHER): Payer: Medicare Other | Admitting: Ophthalmology

## 2020-08-19 DIAGNOSIS — H35372 Puckering of macula, left eye: Secondary | ICD-10-CM

## 2020-08-19 DIAGNOSIS — H43813 Vitreous degeneration, bilateral: Secondary | ICD-10-CM

## 2020-08-25 DIAGNOSIS — E1169 Type 2 diabetes mellitus with other specified complication: Secondary | ICD-10-CM | POA: Diagnosis not present

## 2020-08-29 DIAGNOSIS — L723 Sebaceous cyst: Secondary | ICD-10-CM | POA: Diagnosis not present

## 2020-09-29 DIAGNOSIS — G4733 Obstructive sleep apnea (adult) (pediatric): Secondary | ICD-10-CM | POA: Diagnosis not present

## 2020-12-29 DIAGNOSIS — G4733 Obstructive sleep apnea (adult) (pediatric): Secondary | ICD-10-CM | POA: Diagnosis not present

## 2021-01-22 DIAGNOSIS — E7800 Pure hypercholesterolemia, unspecified: Secondary | ICD-10-CM | POA: Diagnosis not present

## 2021-01-22 DIAGNOSIS — K760 Fatty (change of) liver, not elsewhere classified: Secondary | ICD-10-CM | POA: Diagnosis not present

## 2021-01-22 DIAGNOSIS — M5090 Cervical disc disorder, unspecified, unspecified cervical region: Secondary | ICD-10-CM | POA: Diagnosis not present

## 2021-01-22 DIAGNOSIS — K219 Gastro-esophageal reflux disease without esophagitis: Secondary | ICD-10-CM | POA: Diagnosis not present

## 2021-01-22 DIAGNOSIS — G4700 Insomnia, unspecified: Secondary | ICD-10-CM | POA: Diagnosis not present

## 2021-01-22 DIAGNOSIS — Z1389 Encounter for screening for other disorder: Secondary | ICD-10-CM | POA: Diagnosis not present

## 2021-01-22 DIAGNOSIS — Z0000 Encounter for general adult medical examination without abnormal findings: Secondary | ICD-10-CM | POA: Diagnosis not present

## 2021-01-22 DIAGNOSIS — Z7984 Long term (current) use of oral hypoglycemic drugs: Secondary | ICD-10-CM | POA: Diagnosis not present

## 2021-01-22 DIAGNOSIS — M1990 Unspecified osteoarthritis, unspecified site: Secondary | ICD-10-CM | POA: Diagnosis not present

## 2021-01-22 DIAGNOSIS — G4733 Obstructive sleep apnea (adult) (pediatric): Secondary | ICD-10-CM | POA: Diagnosis not present

## 2021-01-22 DIAGNOSIS — E1169 Type 2 diabetes mellitus with other specified complication: Secondary | ICD-10-CM | POA: Diagnosis not present

## 2021-02-03 DIAGNOSIS — M1712 Unilateral primary osteoarthritis, left knee: Secondary | ICD-10-CM | POA: Diagnosis not present

## 2021-02-03 DIAGNOSIS — M25562 Pain in left knee: Secondary | ICD-10-CM | POA: Diagnosis not present

## 2021-02-03 DIAGNOSIS — M25462 Effusion, left knee: Secondary | ICD-10-CM | POA: Diagnosis not present

## 2021-02-16 ENCOUNTER — Other Ambulatory Visit: Payer: Self-pay | Admitting: Orthopedic Surgery

## 2021-02-16 DIAGNOSIS — M25562 Pain in left knee: Secondary | ICD-10-CM

## 2021-02-18 DIAGNOSIS — Z03818 Encounter for observation for suspected exposure to other biological agents ruled out: Secondary | ICD-10-CM | POA: Diagnosis not present

## 2021-02-18 DIAGNOSIS — R509 Fever, unspecified: Secondary | ICD-10-CM | POA: Diagnosis not present

## 2021-02-26 DIAGNOSIS — R7989 Other specified abnormal findings of blood chemistry: Secondary | ICD-10-CM | POA: Diagnosis not present

## 2021-03-26 DIAGNOSIS — G4733 Obstructive sleep apnea (adult) (pediatric): Secondary | ICD-10-CM | POA: Diagnosis not present

## 2021-04-01 DIAGNOSIS — M7061 Trochanteric bursitis, right hip: Secondary | ICD-10-CM | POA: Diagnosis not present

## 2021-04-01 DIAGNOSIS — M1611 Unilateral primary osteoarthritis, right hip: Secondary | ICD-10-CM | POA: Diagnosis not present

## 2021-04-10 DIAGNOSIS — L821 Other seborrheic keratosis: Secondary | ICD-10-CM | POA: Diagnosis not present

## 2021-04-10 DIAGNOSIS — D225 Melanocytic nevi of trunk: Secondary | ICD-10-CM | POA: Diagnosis not present

## 2021-04-10 DIAGNOSIS — L814 Other melanin hyperpigmentation: Secondary | ICD-10-CM | POA: Diagnosis not present

## 2021-04-10 DIAGNOSIS — L2089 Other atopic dermatitis: Secondary | ICD-10-CM | POA: Diagnosis not present

## 2021-05-04 DIAGNOSIS — S39012A Strain of muscle, fascia and tendon of lower back, initial encounter: Secondary | ICD-10-CM | POA: Diagnosis not present

## 2021-06-18 DIAGNOSIS — L2089 Other atopic dermatitis: Secondary | ICD-10-CM | POA: Diagnosis not present

## 2021-06-18 DIAGNOSIS — L821 Other seborrheic keratosis: Secondary | ICD-10-CM | POA: Diagnosis not present

## 2021-06-18 DIAGNOSIS — D225 Melanocytic nevi of trunk: Secondary | ICD-10-CM | POA: Diagnosis not present

## 2021-06-18 DIAGNOSIS — L814 Other melanin hyperpigmentation: Secondary | ICD-10-CM | POA: Diagnosis not present

## 2021-06-18 DIAGNOSIS — D224 Melanocytic nevi of scalp and neck: Secondary | ICD-10-CM | POA: Diagnosis not present

## 2021-06-24 DIAGNOSIS — G4733 Obstructive sleep apnea (adult) (pediatric): Secondary | ICD-10-CM | POA: Diagnosis not present

## 2021-07-15 DIAGNOSIS — R197 Diarrhea, unspecified: Secondary | ICD-10-CM | POA: Diagnosis not present

## 2021-07-24 DIAGNOSIS — Z1211 Encounter for screening for malignant neoplasm of colon: Secondary | ICD-10-CM | POA: Diagnosis not present

## 2021-07-27 DIAGNOSIS — E1169 Type 2 diabetes mellitus with other specified complication: Secondary | ICD-10-CM | POA: Diagnosis not present

## 2021-07-27 DIAGNOSIS — G4733 Obstructive sleep apnea (adult) (pediatric): Secondary | ICD-10-CM | POA: Diagnosis not present

## 2021-07-27 DIAGNOSIS — E785 Hyperlipidemia, unspecified: Secondary | ICD-10-CM | POA: Diagnosis not present

## 2021-07-27 DIAGNOSIS — R197 Diarrhea, unspecified: Secondary | ICD-10-CM | POA: Diagnosis not present

## 2021-07-30 ENCOUNTER — Other Ambulatory Visit: Payer: Self-pay | Admitting: Gastroenterology

## 2021-07-30 DIAGNOSIS — R194 Change in bowel habit: Secondary | ICD-10-CM | POA: Diagnosis not present

## 2021-07-30 DIAGNOSIS — R634 Abnormal weight loss: Secondary | ICD-10-CM

## 2021-08-19 DIAGNOSIS — N281 Cyst of kidney, acquired: Secondary | ICD-10-CM

## 2021-08-19 HISTORY — DX: Cyst of kidney, acquired: N28.1

## 2021-08-24 ENCOUNTER — Ambulatory Visit
Admission: RE | Admit: 2021-08-24 | Discharge: 2021-08-24 | Disposition: A | Discharge status: Home / Self Care | Payer: Medicare Other | Source: Ambulatory Visit | Attending: Gastroenterology | Admitting: Gastroenterology

## 2021-08-24 DIAGNOSIS — R194 Change in bowel habit: Secondary | ICD-10-CM

## 2021-08-24 DIAGNOSIS — R634 Abnormal weight loss: Secondary | ICD-10-CM

## 2021-08-24 DIAGNOSIS — N281 Cyst of kidney, acquired: Secondary | ICD-10-CM | POA: Diagnosis not present

## 2021-08-24 MED ORDER — IOPAMIDOL (ISOVUE-300) INJECTION 61%
100.0000 mL | Freq: Once | INTRAVENOUS | Status: AC | PRN
  Administered 2021-08-24: 100 mL via INTRAVENOUS

## 2021-09-14 DIAGNOSIS — N281 Cyst of kidney, acquired: Secondary | ICD-10-CM | POA: Diagnosis not present

## 2021-09-25 DIAGNOSIS — G4733 Obstructive sleep apnea (adult) (pediatric): Secondary | ICD-10-CM | POA: Diagnosis not present

## 2021-11-24 DIAGNOSIS — M7021 Olecranon bursitis, right elbow: Secondary | ICD-10-CM | POA: Diagnosis not present

## 2021-11-26 ENCOUNTER — Telehealth: Payer: Self-pay

## 2021-11-26 NOTE — Telephone Encounter (Signed)
Spoke to pt to discuss sclerotherapy. He is playing golf about 2x/week and prefers to wait until fall/winter to schedule.  ?

## 2021-12-17 DIAGNOSIS — R0989 Other specified symptoms and signs involving the circulatory and respiratory systems: Secondary | ICD-10-CM | POA: Diagnosis not present

## 2021-12-22 ENCOUNTER — Other Ambulatory Visit: Payer: Self-pay | Admitting: Physician Assistant

## 2021-12-22 ENCOUNTER — Ambulatory Visit
Admission: RE | Admit: 2021-12-22 | Discharge: 2021-12-22 | Disposition: A | Discharge status: Home / Self Care | Payer: Medicare Other | Source: Ambulatory Visit | Attending: Physician Assistant | Admitting: Physician Assistant

## 2021-12-22 DIAGNOSIS — R051 Acute cough: Secondary | ICD-10-CM | POA: Diagnosis not present

## 2021-12-22 DIAGNOSIS — R0989 Other specified symptoms and signs involving the circulatory and respiratory systems: Secondary | ICD-10-CM

## 2021-12-22 DIAGNOSIS — R059 Cough, unspecified: Secondary | ICD-10-CM | POA: Diagnosis not present

## 2021-12-22 DIAGNOSIS — R911 Solitary pulmonary nodule: Secondary | ICD-10-CM

## 2021-12-24 DIAGNOSIS — G4733 Obstructive sleep apnea (adult) (pediatric): Secondary | ICD-10-CM | POA: Diagnosis not present

## 2022-01-14 ENCOUNTER — Ambulatory Visit
Admission: RE | Admit: 2022-01-14 | Discharge: 2022-01-14 | Disposition: A | Discharge status: Home / Self Care | Payer: Medicare Other | Source: Ambulatory Visit | Attending: Physician Assistant | Admitting: Physician Assistant

## 2022-01-14 DIAGNOSIS — R911 Solitary pulmonary nodule: Secondary | ICD-10-CM

## 2022-01-14 DIAGNOSIS — I700 Atherosclerosis of aorta: Secondary | ICD-10-CM | POA: Diagnosis not present

## 2022-01-14 MED ORDER — IOPAMIDOL (ISOVUE-300) INJECTION 61%
75.0000 mL | Freq: Once | INTRAVENOUS | Status: AC | PRN
  Administered 2022-01-14: 75 mL via INTRAVENOUS

## 2022-01-18 DIAGNOSIS — R911 Solitary pulmonary nodule: Secondary | ICD-10-CM | POA: Diagnosis not present

## 2022-01-18 DIAGNOSIS — I700 Atherosclerosis of aorta: Secondary | ICD-10-CM | POA: Diagnosis not present

## 2022-01-28 DIAGNOSIS — R911 Solitary pulmonary nodule: Secondary | ICD-10-CM | POA: Diagnosis not present

## 2022-01-28 DIAGNOSIS — I700 Atherosclerosis of aorta: Secondary | ICD-10-CM | POA: Diagnosis not present

## 2022-01-28 DIAGNOSIS — N1831 Chronic kidney disease, stage 3a: Secondary | ICD-10-CM | POA: Diagnosis not present

## 2022-01-28 DIAGNOSIS — Z0000 Encounter for general adult medical examination without abnormal findings: Secondary | ICD-10-CM | POA: Diagnosis not present

## 2022-01-28 DIAGNOSIS — G4733 Obstructive sleep apnea (adult) (pediatric): Secondary | ICD-10-CM | POA: Diagnosis not present

## 2022-01-28 DIAGNOSIS — R059 Cough, unspecified: Secondary | ICD-10-CM | POA: Diagnosis not present

## 2022-01-28 DIAGNOSIS — E785 Hyperlipidemia, unspecified: Secondary | ICD-10-CM | POA: Diagnosis not present

## 2022-01-28 DIAGNOSIS — E1122 Type 2 diabetes mellitus with diabetic chronic kidney disease: Secondary | ICD-10-CM | POA: Diagnosis not present

## 2022-02-04 ENCOUNTER — Ambulatory Visit: Payer: Medicare Other | Admitting: Emergency Medicine

## 2022-02-04 ENCOUNTER — Encounter: Payer: Self-pay | Admitting: Emergency Medicine

## 2022-02-04 DIAGNOSIS — R911 Solitary pulmonary nodule: Secondary | ICD-10-CM

## 2022-02-04 NOTE — Patient Instructions (Signed)
We reviewed your CT scan of the chest today. We will perform a PET scan to better characterize her pulmonary nodule We will perform pulmonary function testing Follow Dr. Lamonte Sakai after your testing so we can review.  Based on results we will decide next steps in evaluation

## 2022-02-04 NOTE — Assessment & Plan Note (Signed)
He does not have a heavy tobacco history but he does have a family history of lung cancer.  Left upper lobe nodule suspicious for primary malignancy.  He has great functional capacity and would probably be a candidate for primary resection if he wishes.  I will check a PET scan to look for distant disease, focal hypermetabolism.  Check pulmonary function testing to characterize his ability to tolerate resection if that becomes the plan.  I also talked to him about possible navigational bronchoscopy and biopsies to obtain a tissue diagnosis before proceeding with definitive therapy.  He is going to think about this.  We reviewed your CT scan of the chest today. We will perform a PET scan to better characterize her pulmonary nodule We will perform pulmonary function testing Follow Dr. Lamonte Sakai after your testing so we can review.  Based on results we will decide next steps in evaluation

## 2022-02-04 NOTE — Progress Notes (Signed)
Subjective:    Patient ID: Jason Beard, male    DOB: 04/29/1946, 76 y.o.   MRN: [id number]  HPI 76 year old former smoker (10 pack years) with a history of diabetes, hypertension, atrial flutter (ablated 2014), OSA on CPAP.  He carries a history of asthma that was dx many years ago - some wheeze on a treadmill, when exposed to dust, etc. No inhaled meds in many years.  His cough improved after about 1 month, was treated by his PCP w abx. Had some URI sx in association.  He underwent a chest x-ray 12/22/2021 when he was dealing with acute cough.  Symptoms included.  Chest x-ray showed a left midlung nodular density 1.2 cm prompting CT chest Great functional capacity, no wheeze. Cough has resolved. Very active.    CT scan of the chest 01/14/2022 reviewed by me shows no mediastinal or hilar adenopathy, 2.2 cm peripheral left upper lobe nodular opacity, question impacting the pleural surface.   Review of Systems As per HPI  Past Medical History:  Diagnosis Date   Arthritis    DJD   Asthma    Atrial flutter ([place])    a. s/p ablation 12/2012.   Depression    Diabetes mellitus    Diverticulosis    Essential hypertension 01/29/2013   GERD (gastroesophageal reflux disease)    History of hemorrhoids    History of simple renal cyst    Hyperlipidemia    Nocturia    OSA (obstructive sleep apnea)    Varicose veins    Left Leg     Family History  Problem Relation Age of Onset   Cancer Mother    Heart disease Father    Cancer Sister     Sister >> lung CA  Social History   Socioeconomic History   Marital status: Single    Spouse name: Not on file   Number of children: Not on file   Years of education: Not on file   Highest education level: Not on file  Occupational History   Occupation: retired  Tobacco Use   Smoking status: Former    Years: 30.00    Types: Cigarettes    Quit date: 07/19/1984    Years since quitting: 37.5   Smokeless tobacco: Former    Quit date:  10/24/1961  Vaping Use   Vaping Use: Never used  Substance and Sexual Activity   Alcohol use: Yes    Alcohol/week: 1.0 standard drink of alcohol    Types: 1 Glasses of wine per week   Drug use: No   Sexual activity: Not on file  Other Topics Concern   Not on file  Social History Narrative   Not on file   Social Determinants of Health   Financial Resource Strain: Not on file  Food Insecurity: Not on file  Transportation Needs: Not on file  Physical Activity: Not on file  Stress: Not on file  Social Connections: Not on file  Intimate Partner Violence: Not on file    [place] 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    Allergies  Allergen Reactions   Ampicillin Rash     Outpatient Medications Prior to Visit  Medication Sig Dispense Refill   Coenzyme Q10 (COQ10) 200 MG CAPS Take 200 mg by mouth daily.      Glucosamine 500 MG TABS Take 1 tablet by mouth daily.      metFORMIN (GLUCOPHAGE) 500 MG tablet Take 500 mg by mouth daily with breakfast.  Multiple Vitamins-Minerals (MULTIVITAMIN WITH MINERALS) tablet Take 1 tablet by mouth daily.     Omega-3 Fatty Acids (FISH OIL) 1000 MG CAPS Take 1,000 mg by mouth daily.      pravastatin (PRAVACHOL) 20 MG tablet Take 20 mg by mouth at bedtime.     zolpidem (AMBIEN) 10 MG tablet Take 5-10 mg by mouth at bedtime.     lisinopril (PRINIVIL,ZESTRIL) 2.5 MG tablet Take 2.5 mg by mouth daily.  11   tamsulosin (FLOMAX) 0.4 MG CAPS capsule Take 0.4 mg by mouth at bedtime.     No facility-administered medications prior to visit.        Objective:   Physical Exam  Vitals:   02/04/22 0924  BP: 140/78  Pulse: (!) 48  Temp: 98.4 F (36.9 C)  TempSrc: Oral  SpO2: 96%  Weight: 246 lb 6.4 oz (111.8 kg)  Height: '6\' 1"'$  (1.854 m)    Gen: Pleasant, well-nourished, in no distress,  normal affect  ENT: No lesions,  mouth clear,  oropharynx clear, no postnasal drip  Neck: No JVD, no stridor  Lungs: No use of  accessory muscles, no crackles or wheezing on normal respiration, no wheeze on forced expiration  Cardiovascular: RRR, heart sounds normal, no murmur or gallops, no peripheral edema  Musculoskeletal: No deformities, no cyanosis or clubbing  Neuro: alert, awake, non focal  Skin: Warm, no lesions or rash     Assessment & Plan:   Pulmonary nodule He does not have a heavy tobacco history but he does have a family history of lung cancer.  Left upper lobe nodule suspicious for primary malignancy.  He has great functional capacity and would probably be a candidate for primary resection if he wishes.  I will check a PET scan to look for distant disease, focal hypermetabolism.  Check pulmonary function testing to characterize his ability to tolerate resection if that becomes the plan.  I also talked to him about possible navigational bronchoscopy and biopsies to obtain a tissue diagnosis before proceeding with definitive therapy.  He is going to think about this.  We reviewed your CT scan of the chest today. We will perform a PET scan to better characterize her pulmonary nodule We will perform pulmonary function testing Follow Dr. Lamonte Sakai after your testing so we can review.  Based on results we will decide next steps in evaluation   Baltazar Apo, MD, PhD 02/04/2022, 10:06 AM [facility] [phone] or if no answer before 7:00PM call [phone] For any issues after 7:00PM please call eLink [phone]

## 2022-02-04 NOTE — Addendum Note (Signed)
Addended by: Gavin Potters R on: 02/04/2022 10:14 AM   Modules accepted: Orders

## 2022-02-12 ENCOUNTER — Ambulatory Visit (HOSPITAL_COMMUNITY)
Admission: RE | Admit: 2022-02-12 | Discharge: 2022-02-12 | Disposition: A | Discharge status: Home / Self Care | Payer: Medicare Other | Source: Ambulatory Visit | Attending: Emergency Medicine | Admitting: Emergency Medicine

## 2022-02-12 DIAGNOSIS — R911 Solitary pulmonary nodule: Secondary | ICD-10-CM | POA: Insufficient documentation

## 2022-02-12 LAB — GLUCOSE, CAPILLARY: Glucose-Capillary: 167 mg/dL — ABNORMAL HIGH (ref 70–99)

## 2022-02-12 MED ORDER — FLUDEOXYGLUCOSE F - 18 (FDG) INJECTION
12.3000 | Freq: Once | INTRAVENOUS | Status: AC
  Administered 2022-02-12: 12.3 via INTRAVENOUS

## 2022-02-15 ENCOUNTER — Telehealth: Payer: Self-pay | Admitting: Emergency Medicine

## 2022-02-15 NOTE — Telephone Encounter (Signed)
Please advise on results from 02/12/22. He was concerned about the results.

## 2022-02-16 ENCOUNTER — Encounter: Payer: Self-pay | Admitting: Emergency Medicine

## 2022-02-16 NOTE — Telephone Encounter (Signed)
Please advise 

## 2022-02-17 NOTE — Telephone Encounter (Signed)
I sent him a my chart message.

## 2022-03-02 ENCOUNTER — Ambulatory Visit (INDEPENDENT_AMBULATORY_CARE_PROVIDER_SITE_OTHER): Payer: Medicare Other | Admitting: Emergency Medicine

## 2022-03-02 DIAGNOSIS — R911 Solitary pulmonary nodule: Secondary | ICD-10-CM | POA: Diagnosis not present

## 2022-03-02 LAB — PULMONARY FUNCTION TEST
DL/VA % pred: 109 %
DL/VA: 4.25 ml/min/mmHg/L
DLCO cor % pred: 91 %
DLCO cor: 24.08 ml/min/mmHg
DLCO unc % pred: 91 %
DLCO unc: 24.08 ml/min/mmHg
FEF 25-75 Post: 3.64 L/sec
FEF 25-75 Pre: 3.03 L/sec
FEF2575-%Change-Post: 20 %
FEF2575-%Pred-Post: 160 %
FEF2575-%Pred-Pre: 133 %
FEV1-%Change-Post: 3 %
FEV1-%Pred-Post: 88 %
FEV1-%Pred-Pre: 85 %
FEV1-Post: 2.83 L
FEV1-Pre: 2.74 L
FEV1FVC-%Change-Post: -1 %
FEV1FVC-%Pred-Pre: 114 %
FEV6-%Change-Post: 4 %
FEV6-%Pred-Post: 83 %
FEV6-%Pred-Pre: 79 %
FEV6-Post: 3.49 L
FEV6-Pre: 3.33 L
FEV6FVC-%Pred-Post: 106 %
FEV6FVC-%Pred-Pre: 106 %
FVC-%Change-Post: 4 %
FVC-%Pred-Post: 78 %
FVC-%Pred-Pre: 74 %
FVC-Post: 3.49 L
FVC-Pre: 3.33 L
Post FEV1/FVC ratio: 81 %
Post FEV6/FVC ratio: 100 %
Pre FEV1/FVC ratio: 82 %
Pre FEV6/FVC Ratio: 100 %
RV % pred: 89 %
RV: 2.43 L
TLC % pred: 77 %
TLC: 5.77 L

## 2022-03-02 NOTE — Progress Notes (Signed)
PFT done today. 

## 2022-03-23 ENCOUNTER — Encounter: Payer: Self-pay | Admitting: Emergency Medicine

## 2022-03-23 ENCOUNTER — Ambulatory Visit: Payer: Medicare Other | Admitting: Emergency Medicine

## 2022-03-23 VITALS — BP 142/82 | HR 52 | Temp 98.0°F | Ht 72.0 in | Wt 252.4 lb

## 2022-03-23 DIAGNOSIS — R590 Localized enlarged lymph nodes: Secondary | ICD-10-CM

## 2022-03-23 DIAGNOSIS — R911 Solitary pulmonary nodule: Secondary | ICD-10-CM | POA: Diagnosis not present

## 2022-03-23 DIAGNOSIS — Z01812 Encounter for preprocedural laboratory examination: Secondary | ICD-10-CM | POA: Diagnosis not present

## 2022-03-23 NOTE — Assessment & Plan Note (Signed)
He has small enlarged lymph nodes at station 4L and station 7, neither of which is hypermetabolic on PET scan.  The left lobe nodule is hypermetabolic.  I think before committing him to surgical resection we need to evaluate the nodes.  4L is likely not reachable but 7 is accessible.  He agrees.  I will plan for robotic assisted navigation and EBUS.  Risks, benefits, rationale explained to him today.  We will try to get this scheduled for 04/05/2022 which is best for his schedule.

## 2022-03-23 NOTE — Progress Notes (Signed)
   Subjective:    Patient ID: Jason Beard, male    DOB: [birth year], 76 y.o.   MRN: [id number]  HPI 76 year old former smoker (10 pack years) with a history of diabetes, hypertension, atrial flutter (ablated 2014), OSA on CPAP.  He carries a history of asthma that was dx many years ago - some wheeze on a treadmill, when exposed to dust, etc. No inhaled meds in many years.  His cough improved after about 1 month, was treated by his PCP w abx. Had some URI sx in association.  He underwent a chest x-ray 12/22/2021 when he was dealing with acute cough.  Symptoms included.  Chest x-ray showed a left midlung nodular density 1.2 cm prompting CT chest Great functional capacity, no wheeze. Cough has resolved. Very active.   CT scan of the chest 01/14/2022 reviewed by me shows no mediastinal or hilar adenopathy, 2.2 cm peripheral left upper lobe nodular opacity, question impacting the pleural surface.   [name] 03/23/22 --follow-up visit 76 year old gentleman with diabetes, hypertension, a flutter, OSA on CPAP, suspected asthma.  We are following a 2.2 cm peripheral left upper lobe nodular opacity noted on the CT chest 01/14/2022 to evaluate cough.  He has undergone PET scan and pulmonary function testing as below  PET scan 02/12/2022 reviewed by me shows that the 2.1 x 1.2 cm peripheral left upper lobe pulmonary nodule is hypermetabolic.  There is a prominent mediastinal lymph node in the periaortic region, also slightly enlarged subcarinal node 8 mm.  Neither of these are hypermetabolic.  Pulmonary function testing performed 03/02/2022 reviewed by me shows possible mixed mild obstructive and restrictive disease on spirometry.  No response to bronchodilator.  Mild restriction based on decreased TLC.  Normal diffusion capacity.   Review of Systems As per HPI      Objective:   Physical Exam  Vitals:   03/23/22 0904  BP: (!) 142/82  Pulse: (!) 52  Temp: 98 F (36.7 C)  TempSrc: Oral  SpO2: 97%   Weight: 252 lb 6.4 oz (114.5 kg)  Height: 6' (1.829 m)    Gen: Pleasant, well-nourished, in no distress,  normal affect  ENT: No lesions,  mouth clear,  oropharynx clear, no postnasal drip  Neck: No JVD, no stridor  Lungs: No use of accessory muscles, no crackles or wheezing on normal respiration, no wheeze on forced expiration  Cardiovascular: RRR, heart sounds normal, no murmur or gallops, no peripheral edema  Musculoskeletal: No deformities, no cyanosis or clubbing  Neuro: alert, awake, non focal  Skin: Warm, no lesions or rash     Assessment & Plan:   Pulmonary nodule He has small enlarged lymph nodes at station 4L and station 7, neither of which is hypermetabolic on PET scan.  The left lobe nodule is hypermetabolic.  I think before committing him to surgical resection we need to evaluate the nodes.  4L is likely not reachable but 7 is accessible.  He agrees.  I will plan for robotic assisted navigation and EBUS.  Risks, benefits, rationale explained to him today.  We will try to get this scheduled for 04/05/2022 which is best for his schedule.   Baltazar Apo, MD, PhD 03/23/2022, 9:36 AM [facility] [phone] or if no answer before 7:00PM call [phone] For any issues after 7:00PM please call eLink [phone]

## 2022-03-23 NOTE — Patient Instructions (Addendum)
We reviewed your PET scan and PFT today.  We will work on setting up bronchoscopy to evaluate your left upper lobe nodule and lymph nodes. This would be done as an outpatient under general anesthesia in Endoscopy at [facility]. We will work on getting this done on 04/05/22.  Follow with Dr Lamonte Sakai in 1 month

## 2022-03-23 NOTE — H&P (View-Only) (Signed)
   Subjective:    Patient ID: Jason Beard, male    DOB: Mar 18, 1946, 76 y.o.   MRN: [id number]  HPI 76 year old former smoker (10 pack years) with a history of diabetes, hypertension, atrial flutter (ablated 2014), OSA on CPAP.  He carries a history of asthma that was dx many years ago - some wheeze on a treadmill, when exposed to dust, etc. No inhaled meds in many years.  His cough improved after about 1 month, was treated by his PCP w abx. Had some URI sx in association.  He underwent a chest x-ray 12/22/2021 when he was dealing with acute cough.  Symptoms included.  Chest x-ray showed a left midlung nodular density 1.2 cm prompting CT chest Great functional capacity, no wheeze. Cough has resolved. Very active.   CT scan of the chest 01/14/2022 reviewed by me shows no mediastinal or hilar adenopathy, 2.2 cm peripheral left upper lobe nodular opacity, question impacting the pleural surface.   [name] 03/23/22 --follow-up visit 76 year old gentleman with diabetes, hypertension, a flutter, OSA on CPAP, suspected asthma.  We are following a 2.2 cm peripheral left upper lobe nodular opacity noted on the CT chest 01/14/2022 to evaluate cough.  He has undergone PET scan and pulmonary function testing as below  PET scan 02/12/2022 reviewed by me shows that the 2.1 x 1.2 cm peripheral left upper lobe pulmonary nodule is hypermetabolic.  There is a prominent mediastinal lymph node in the periaortic region, also slightly enlarged subcarinal node 8 mm.  Neither of these are hypermetabolic.  Pulmonary function testing performed 03/02/2022 reviewed by me shows possible mixed mild obstructive and restrictive disease on spirometry.  No response to bronchodilator.  Mild restriction based on decreased TLC.  Normal diffusion capacity.   Review of Systems As per HPI      Objective:   Physical Exam  Vitals:   03/23/22 0904  BP: (!) 142/82  Pulse: (!) 52  Temp: 98 F (36.7 C)  TempSrc: Oral  SpO2: 97%   Weight: 252 lb 6.4 oz (114.5 kg)  Height: 6' (1.829 m)    Gen: Pleasant, well-nourished, in no distress,  normal affect  ENT: No lesions,  mouth clear,  oropharynx clear, no postnasal drip  Neck: No JVD, no stridor  Lungs: No use of accessory muscles, no crackles or wheezing on normal respiration, no wheeze on forced expiration  Cardiovascular: RRR, heart sounds normal, no murmur or gallops, no peripheral edema  Musculoskeletal: No deformities, no cyanosis or clubbing  Neuro: alert, awake, non focal  Skin: Warm, no lesions or rash     Assessment & Plan:   Pulmonary nodule He has small enlarged lymph nodes at station 4L and station 7, neither of which is hypermetabolic on PET scan.  The left lobe nodule is hypermetabolic.  I think before committing him to surgical resection we need to evaluate the nodes.  4L is likely not reachable but 7 is accessible.  He agrees.  I will plan for robotic assisted navigation and EBUS.  Risks, benefits, rationale explained to him today.  We will try to get this scheduled for 04/05/2022 which is best for his schedule.   Baltazar Apo, MD, PhD 03/23/2022, 9:36 AM [facility] [phone] or if no answer before 7:00PM call [phone] For any issues after 7:00PM please call eLink [phone]

## 2022-03-24 DIAGNOSIS — G4733 Obstructive sleep apnea (adult) (pediatric): Secondary | ICD-10-CM | POA: Diagnosis not present

## 2022-03-30 DIAGNOSIS — Z7984 Long term (current) use of oral hypoglycemic drugs: Secondary | ICD-10-CM | POA: Diagnosis not present

## 2022-03-30 DIAGNOSIS — D3131 Benign neoplasm of right choroid: Secondary | ICD-10-CM | POA: Diagnosis not present

## 2022-03-30 DIAGNOSIS — H35372 Puckering of macula, left eye: Secondary | ICD-10-CM | POA: Diagnosis not present

## 2022-03-30 DIAGNOSIS — E119 Type 2 diabetes mellitus without complications: Secondary | ICD-10-CM | POA: Diagnosis not present

## 2022-04-01 ENCOUNTER — Ambulatory Visit (HOSPITAL_COMMUNITY)
Admission: RE | Admit: 2022-04-01 | Discharge: 2022-04-01 | Disposition: A | Discharge status: Home / Self Care | Payer: Medicare Other | Source: Ambulatory Visit | Attending: Emergency Medicine | Admitting: Emergency Medicine

## 2022-04-01 DIAGNOSIS — R911 Solitary pulmonary nodule: Secondary | ICD-10-CM

## 2022-04-01 DIAGNOSIS — I700 Atherosclerosis of aorta: Secondary | ICD-10-CM | POA: Diagnosis not present

## 2022-04-02 ENCOUNTER — Encounter (HOSPITAL_COMMUNITY): Payer: Self-pay | Admitting: Emergency Medicine

## 2022-04-02 ENCOUNTER — Other Ambulatory Visit: Payer: Medicare Other

## 2022-04-02 DIAGNOSIS — Z01812 Encounter for preprocedural laboratory examination: Secondary | ICD-10-CM

## 2022-04-02 NOTE — Progress Notes (Signed)
PCP - Dr Lysle Rubens Cardiologist - nn/a  CT Chest x-ray - 9/15/236/29/23 EKG - n/a Stress Test - 08/28/14 ECHO -  Cardiac Cath - n/a  ICD Pacemaker/Loop - n/a  Sleep Study -  Yes CPAP - uses CPAP nightly  Anesthesia review: Yes  Diabetic type 2, no meds, does not check blood sugar.  STOP now taking any Aspirin (unless otherwise instructed by your surgeon), Aleve, Naproxen, Ibuprofen, Motrin, Advil, Goody's, BC's, all herbal medications, fish oil, and all vitamins.   Coronavirus Screening Covid test scheduled on 04/02/22 - results pending. Do you have any of the following symptoms:  Cough Yes-occasional Fever (>100.70F)  yes/no: No Runny nose yes/no: No Sore throat yes/no: No Difficulty breathing/shortness of breath  yes/no: No  Have you traveled in the last 14 days and where? yes/no: No  Patient verbalized understanding of instructions that were given via phone.

## 2022-04-02 NOTE — Progress Notes (Signed)
Anesthesia Chart Review: Jason Beard  Case: [id number] Date/Time: 04/05/22 0915   Procedures:      ROBOTIC ASSISTED NAVIGATIONAL BRONCHOSCOPY     VIDEO BRONCHOSCOPY WITH ENDOBRONCHIAL ULTRASOUND   Anesthesia type: General   Pre-op diagnosis: Left upper lobe nodule, mediastinal adenopathy   Location: [facility] ROOM 3 / [facility]   Surgeons: [name], MD       DISCUSSION: Patient is a 76 year old male scheduled for the above procedure.  History includes former smoker (quit 07/19/84), HLD, asthma, GERD, DM2, HTN, atrial flutter (s/p ablation 12/28/12), OSA (CPAP), varicose veins (Ls/p right GSV laser ablation 06/25/12).  He is a same day work-up. Last available labs are > 30 days. Last EKG > 1  year. Anesthesia team to evaluate on the day of surgery.  04/01/2022 presurgical COVID-19 test is in process.   VS: BP Readings from Last 3 Encounters:  03/23/22 (!) 142/82  02/04/22 140/78  08/11/18 132/78   Pulse Readings from Last 3 Encounters:  03/23/22 (!) 52  02/04/22 (!) 48  08/11/18 72     PROVIDERS: Wenda Low, MD Larae Grooms, MD is cardiologist. Last visit on 08/20/14 for atypical chest pain.  He had a normal stress test. Virl Axe, MD is EP.  Last visit 01/29/2013.  No clinical recurrence of atrial flutter since 12/28/12 ablation. As needed EP follow-up recommended.  LABS: For day of surgery as indicated.  PFTs FVC 3.33 (74%), post 3.49 (78%). FEV1 2.74 (85%), post 2.83 (88%). DLCO unc/cor 24.08 (91%).  IMAGES: CT Super D Chest 04/01/22: IMPRESSION: 1. Interval decrease in size of the anterior left upper lobe pulmonary nodule which was hypermetabolic on prior PET-CT, now measuring 18 x 13 mm. Possibly reflecting a resolving infectious or inflammatory process consider further evaluation with short-term interval follow-up dedicated chest CT. 2. No significant interval change in size of the prominent mediastinal lymph nodes which were not  hypermetabolic on prior PET-CT. 3. Diffuse subpleural reticulations may reflect interstitial lung disease. Consider further evaluation with high-resolution ILD protocol chest CT with inspiratory and expiratory imaging. 4. Hepatic morphologic changes suggestive of cirrhosis. 5.  Aortic Atherosclerosis (ICD10-I70.0).   PET Scan 02/12/22: IMPRESSION: 1. Hypermetabolic anterior left upper lobe pulmonary nodule is highly suspicious for primary bronchogenic neoplasm. 2. Prominent mediastinal lymph nodes do not demonstrate abnormal FDG avidity and are favored reactive. Attention on follow-up imaging suggested. 3. No convincing scintigraphic evidence of hypermetabolic metastatic disease in the neck, chest, abdomen or pelvis. 4. Heterogeneous nodular activity in enlarged prostate gland is nonspecific but commonly reflects benign prostatic hyperplasia consider correlation with clinical history and serum PSA. 5.  Aortic Atherosclerosis (ICD10-I70.0).    EKG: Last EKG available is greater than 1 year ago; 08/11/2018: Sinus rhythm, abnormal R wave progression, early transition, LVH, no acute ST/T wave changes.  Negative T wave in V3   CV: Nuclear stress test 08/27/14: Overall Impression:  Normal stress nuclear study. There is no scar or ischemia. The patient has only fair exercise tolerance. This is a low risk scan. LV Ejection Fraction: 56%.  LV Wall Motion:  Normal Wall Motion   Echo 11/21/12: Conclusions: 1.  Left ventricular ejection fraction estimated by 2D at 60 to 65%. 2.  Mild left atrial lodgment. 3.  The aortic valve is sclerotic but opens well.   Past Medical History:  Diagnosis Date   Arthritis    DJD   Asthma    Atrial flutter ([place])    a. s/p ablation 12/2012.  Depression    Diabetes mellitus    Diverticulosis    Essential hypertension 01/29/2013   GERD (gastroesophageal reflux disease)    History of hemorrhoids    History of simple renal cyst    Hyperlipidemia     Nocturia    OSA (obstructive sleep apnea)    Varicose veins    Left Leg    Past Surgical History:  Procedure Laterality Date   ATRIAL FLUTTER ABLATION N/A 12/28/2012   Procedure: ATRIAL FLUTTER ABLATION;  Surgeon: Deboraha Sprang, MD;  Location: [facility] CATH LAB;  Service: Cardiovascular;  Laterality: N/A;   COLONOSCOPY WITH PROPOFOL N/A 01/03/2017   Procedure: COLONOSCOPY WITH PROPOFOL;  Surgeon: Garlan Fair, MD;  Location: [facility] ENDOSCOPY;  Service: Endoscopy;  Laterality: N/A;  [phone]    MEDICATIONS: No current facility-administered medications for this encounter.    albuterol (VENTOLIN HFA) 108 (90 Base) MCG/ACT inhaler   ascorbic acid (VITAMIN C) 500 MG tablet   cholecalciferol (VITAMIN D3) 25 MCG (1000 UNIT) tablet   Coenzyme Q10 (COQ10 PO)   Glucosamine HCl (GLUCOSAMINE PO)   levocetirizine (XYZAL) 5 MG tablet   losartan (COZAAR) 25 MG tablet   Magnesium 250 MG TABS   meloxicam (MOBIC) 15 MG tablet   metFORMIN (GLUCOPHAGE) 500 MG tablet   Multiple Vitamins-Minerals (MULTIVITAMIN WITH MINERALS) tablet   Multiple Vitamins-Minerals (PRESERVISION AREDS 2) CAPS   Omega-3 Fatty Acids (FISH OIL) 1000 MG CAPS   Polyethyl Glycol-Propyl Glycol (SYSTANE ULTRA OP)   Potassium 99 MG TABS   pravastatin (PRAVACHOL) 20 MG tablet   psyllium (REGULOID) 0.52 g capsule   triamcinolone cream (KENALOG) 0.1 %   zolpidem (AMBIEN) 10 MG tablet    Myra Gianotti, PA-C Surgical Short Stay/Anesthesiology [facility] Phone [phone] [facility] Phone [phone] 04/02/2022 1:31 PM

## 2022-04-02 NOTE — Anesthesia Preprocedure Evaluation (Signed)
Anesthesia Evaluation  Patient identified by MRN, date of birth, ID band Patient awake    Reviewed: Allergy & Precautions, NPO status , Patient's Chart, lab work & pertinent test results  Airway Mallampati: II  TM Distance: >3 FB Neck ROM: Full    Dental no notable dental hx.    Pulmonary asthma , sleep apnea and Continuous Positive Airway Pressure Ventilation , former smoker,  LUL nodule   Pulmonary exam normal        Cardiovascular hypertension, Pt. on medications  Rhythm:Regular Rate:Normal     Neuro/Psych Depression negative neurological ROS     GI/Hepatic Neg liver ROS, GERD  ,  Endo/Other  diabetes, Type 2, Oral Hypoglycemic Agents  Renal/GU   negative genitourinary   Musculoskeletal  (+) Arthritis , Osteoarthritis,    Abdominal Normal abdominal exam  (+)   Peds  Hematology negative hematology ROS (+)   Anesthesia Other Findings   Reproductive/Obstetrics                            Anesthesia Physical Anesthesia Plan  ASA: 3  Anesthesia Plan: General   Post-op Pain Management:    Induction: Intravenous  PONV Risk Score and Plan: 2 and Ondansetron, Dexamethasone and Treatment may vary due to age or medical condition  Airway Management Planned: Mask and Oral ETT  Additional Equipment: None  Intra-op Plan:   Post-operative Plan: Extubation in OR  Informed Consent: I have reviewed the patients History and Physical, chart, labs and discussed the procedure including the risks, benefits and alternatives for the proposed anesthesia with the patient or authorized representative who has indicated his/her understanding and acceptance.     Dental advisory given  Plan Discussed with: CRNA  Anesthesia Plan Comments: (PAT note written 04/02/2022 by Shonna Chock, PA-C. )       Anesthesia Quick Evaluation

## 2022-04-05 ENCOUNTER — Ambulatory Visit (HOSPITAL_COMMUNITY): Payer: Medicare Other | Admitting: Vascular Surgery

## 2022-04-05 ENCOUNTER — Ambulatory Visit (HOSPITAL_BASED_OUTPATIENT_CLINIC_OR_DEPARTMENT_OTHER): Payer: Medicare Other | Admitting: Vascular Surgery

## 2022-04-05 ENCOUNTER — Encounter (HOSPITAL_COMMUNITY)
Admission: RE | Disposition: A | Discharge status: Home / Self Care | Payer: Self-pay | Source: Ambulatory Visit | Attending: Emergency Medicine

## 2022-04-05 ENCOUNTER — Encounter (HOSPITAL_COMMUNITY): Payer: Self-pay | Admitting: Emergency Medicine

## 2022-04-05 ENCOUNTER — Ambulatory Visit (HOSPITAL_COMMUNITY): Payer: Medicare Other

## 2022-04-05 ENCOUNTER — Ambulatory Visit (HOSPITAL_COMMUNITY)
Admission: RE | Admit: 2022-04-05 | Discharge: 2022-04-05 | Disposition: A | Discharge status: Home / Self Care | Payer: Medicare Other | Source: Ambulatory Visit | Attending: Emergency Medicine | Admitting: Emergency Medicine

## 2022-04-05 DIAGNOSIS — G4733 Obstructive sleep apnea (adult) (pediatric): Secondary | ICD-10-CM | POA: Insufficient documentation

## 2022-04-05 DIAGNOSIS — Z7984 Long term (current) use of oral hypoglycemic drugs: Secondary | ICD-10-CM | POA: Insufficient documentation

## 2022-04-05 DIAGNOSIS — Z87891 Personal history of nicotine dependence: Secondary | ICD-10-CM | POA: Diagnosis not present

## 2022-04-05 DIAGNOSIS — E119 Type 2 diabetes mellitus without complications: Secondary | ICD-10-CM | POA: Diagnosis not present

## 2022-04-05 DIAGNOSIS — K219 Gastro-esophageal reflux disease without esophagitis: Secondary | ICD-10-CM | POA: Diagnosis not present

## 2022-04-05 DIAGNOSIS — I10 Essential (primary) hypertension: Secondary | ICD-10-CM | POA: Insufficient documentation

## 2022-04-05 DIAGNOSIS — R590 Localized enlarged lymph nodes: Secondary | ICD-10-CM | POA: Diagnosis not present

## 2022-04-05 DIAGNOSIS — Z9989 Dependence on other enabling machines and devices: Secondary | ICD-10-CM | POA: Diagnosis not present

## 2022-04-05 DIAGNOSIS — Z20822 Contact with and (suspected) exposure to covid-19: Secondary | ICD-10-CM | POA: Insufficient documentation

## 2022-04-05 DIAGNOSIS — R911 Solitary pulmonary nodule: Secondary | ICD-10-CM | POA: Diagnosis not present

## 2022-04-05 HISTORY — PX: BRONCHIAL BRUSHINGS: SHX5108

## 2022-04-05 HISTORY — DX: Trochanteric bursitis, right hip: M70.61

## 2022-04-05 HISTORY — DX: Carpal tunnel syndrome, bilateral upper limbs: G56.03

## 2022-04-05 HISTORY — PX: BRONCHIAL BIOPSY: SHX5109

## 2022-04-05 HISTORY — PX: VIDEO BRONCHOSCOPY WITH RADIAL ENDOBRONCHIAL ULTRASOUND: SHX6849

## 2022-04-05 HISTORY — PX: VIDEO BRONCHOSCOPY WITH ENDOBRONCHIAL ULTRASOUND: SHX6177

## 2022-04-05 HISTORY — PX: BRONCHIAL NEEDLE ASPIRATION BIOPSY: SHX5106

## 2022-04-05 LAB — CBC
HCT: 44 % (ref 39.0–52.0)
Hemoglobin: 15.2 g/dL (ref 13.0–17.0)
MCH: 29.6 pg (ref 26.0–34.0)
MCHC: 34.5 g/dL (ref 30.0–36.0)
MCV: 85.8 fL (ref 80.0–100.0)
Platelets: 161 10*3/uL (ref 150–400)
RBC: 5.13 MIL/uL (ref 4.22–5.81)
RDW: 13.1 % (ref 11.5–15.5)
WBC: 7.6 10*3/uL (ref 4.0–10.5)
nRBC: 0 % (ref 0.0–0.2)

## 2022-04-05 LAB — BASIC METABOLIC PANEL
Anion gap: 13 (ref 5–15)
BUN: 25 mg/dL — ABNORMAL HIGH (ref 8–23)
CO2: 21 mmol/L — ABNORMAL LOW (ref 22–32)
Calcium: 9.6 mg/dL (ref 8.9–10.3)
Chloride: 103 mmol/L (ref 98–111)
Creatinine, Ser: 1.26 mg/dL — ABNORMAL HIGH (ref 0.61–1.24)
GFR, Estimated: 58 mL/min — ABNORMAL LOW (ref >60)
Glucose, Bld: 179 mg/dL — ABNORMAL HIGH (ref 70–99)
Potassium: 4.5 mmol/L (ref 3.5–5.1)
Sodium: 137 mmol/L (ref 135–145)

## 2022-04-05 LAB — GLUCOSE, CAPILLARY
Glucose-Capillary: 195 mg/dL — ABNORMAL HIGH (ref 70–99)
Glucose-Capillary: 164 mg/dL — ABNORMAL HIGH (ref 70–99)

## 2022-04-05 LAB — SARS CORONAVIRUS 2 BY RT PCR: SARS Coronavirus 2 by RT PCR: NEGATIVE

## 2022-04-05 SURGERY — BRONCHOSCOPY, WITH BIOPSY USING ELECTROMAGNETIC NAVIGATION
Anesthesia: General

## 2022-04-05 MED ORDER — LACTATED RINGERS IV SOLN: INTRAVENOUS | Status: DC

## 2022-04-05 MED ORDER — INSULIN ASPART 100 UNIT/ML IJ SOLN
0.0000 [IU] | INTRAMUSCULAR | Status: DC | PRN
  Administered 2022-04-05: 2 [IU] via SUBCUTANEOUS

## 2022-04-05 MED ORDER — SUGAMMADEX SODIUM 200 MG/2ML IV SOLN
INTRAVENOUS | Status: DC | PRN
  Administered 2022-04-05: 200 mg via INTRAVENOUS

## 2022-04-05 MED ORDER — PROPOFOL 10 MG/ML IV BOLUS
INTRAVENOUS | Status: DC | PRN
  Administered 2022-04-05: 30 mg via INTRAVENOUS
  Administered 2022-04-05: 20 mg via INTRAVENOUS
  Administered 2022-04-05: 170 mg via INTRAVENOUS
  Administered 2022-04-05: 20 mg via INTRAVENOUS
  Administered 2022-04-05 (×2): 30 mg via INTRAVENOUS
  Administered 2022-04-05: 20 mg via INTRAVENOUS

## 2022-04-05 MED ORDER — LIDOCAINE 2% (20 MG/ML) 5 ML SYRINGE
INTRAMUSCULAR | Status: DC | PRN
  Administered 2022-04-05: 60 mg via INTRAVENOUS

## 2022-04-05 MED ORDER — ONDANSETRON HCL 4 MG/2ML IJ SOLN
INTRAMUSCULAR | Status: DC | PRN
  Administered 2022-04-05: 4 mg via INTRAVENOUS

## 2022-04-05 MED ORDER — INSULIN ASPART 100 UNIT/ML IJ SOLN
INTRAMUSCULAR | Status: AC
  Filled 2022-04-05: qty 1

## 2022-04-05 MED ORDER — ROCURONIUM BROMIDE 10 MG/ML (PF) SYRINGE
PREFILLED_SYRINGE | INTRAVENOUS | Status: DC | PRN
  Administered 2022-04-05: 70 mg via INTRAVENOUS
  Administered 2022-04-05: 20 mg via INTRAVENOUS

## 2022-04-05 MED ORDER — FENTANYL CITRATE (PF) 100 MCG/2ML IJ SOLN: 25.0000 ug | INTRAMUSCULAR | Status: DC | PRN

## 2022-04-05 MED ORDER — ACETAMINOPHEN 10 MG/ML IV SOLN: 1000.0000 mg | Freq: Once | INTRAVENOUS | Status: DC | PRN

## 2022-04-05 MED ORDER — PHENYLEPHRINE HCL-NACL 20-0.9 MG/250ML-% IV SOLN
INTRAVENOUS | Status: DC | PRN
  Administered 2022-04-05: 25 ug/min via INTRAVENOUS

## 2022-04-05 MED ORDER — FENTANYL CITRATE (PF) 250 MCG/5ML IJ SOLN
INTRAMUSCULAR | Status: DC | PRN
  Administered 2022-04-05: 100 ug via INTRAVENOUS

## 2022-04-05 MED ORDER — DEXAMETHASONE SODIUM PHOSPHATE 10 MG/ML IJ SOLN
INTRAMUSCULAR | Status: DC | PRN
  Administered 2022-04-05: 5 mg via INTRAVENOUS

## 2022-04-05 SURGICAL SUPPLY — 1 items: fiducial marker IMPLANT

## 2022-04-05 NOTE — Interval H&P Note (Signed)
History and Physical Interval Note:  04/05/2022 7:38 AM  Jason Beard  has presented today for surgery, with the diagnosis of Left upper lobe nodule, mediastinal adenopathy.  The various methods of treatment have been discussed with the patient and family. After consideration of risks, benefits and other options for treatment, the patient has consented to  Procedure(s): ROBOTIC ASSISTED NAVIGATIONAL BRONCHOSCOPY (N/A) VIDEO BRONCHOSCOPY WITH ENDOBRONCHIAL ULTRASOUND (N/A) as a surgical intervention.  The patient's history has been reviewed, patient examined, no change in status, stable for surgery.  I have reviewed the patient's chart and labs.  Questions were answered to the patient's satisfaction.     [name]

## 2022-04-05 NOTE — Transfer of Care (Signed)
Immediate Anesthesia Transfer of Care Note  Patient: Jason Beard  Procedure(s) Performed: ROBOTIC ASSISTED NAVIGATIONAL BRONCHOSCOPY VIDEO BRONCHOSCOPY WITH ENDOBRONCHIAL ULTRASOUND BRONCHIAL BRUSHINGS BRONCHIAL NEEDLE ASPIRATION BIOPSIES BRONCHIAL BIOPSIES VIDEO BRONCHOSCOPY WITH RADIAL ENDOBRONCHIAL ULTRASOUND  Patient Location: PACU  Anesthesia Type:General  Level of Consciousness: awake, alert  and oriented  Airway & Oxygen Therapy: Patient Spontanous Breathing and Patient connected to face mask oxygen  Post-op Assessment: Report given to RN, Post -op Vital signs reviewed and stable and Patient moving all extremities X 4  Post vital signs: Reviewed and stable  Last Vitals:  Vitals Value Taken Time  BP    Temp    Pulse    Resp    SpO2      Last Pain:  Vitals:   04/05/22 0735  TempSrc:   PainSc: 0-No pain         Complications: No notable events documented.

## 2022-04-05 NOTE — Discharge Instructions (Signed)
Flexible Bronchoscopy, Care After This sheet gives you information about how to care for yourself after your test. Your doctor may also give you more specific instructions. If you have problems or questions, contact your doctor. Follow these instructions at home: Eating and drinking When your numbness is gone and your cough and gag reflexes have come back, you may: Eat only soft foods. Slowly drink liquids. The day after the test, go back to your normal diet. Driving Do not drive for 24 hours if you were given a medicine to help you relax (sedative). Do not drive or use heavy machinery while taking prescription pain medicine. General instructions  Take over-the-counter and prescription medicines only as told by your doctor. Return to your normal activities as told. Ask what activities are safe for you. Do not use any products that have nicotine or tobacco in them. This includes cigarettes and e-cigarettes. If you need help quitting, ask your doctor. Keep all follow-up visits as told by your doctor. This is important. It is very important if you had a tissue sample (biopsy) taken. Get help right away if: You have shortness of breath that gets worse. You get light-headed. You feel like you are going to pass out (faint). You have chest pain. You cough up: More than a little blood. More blood than before. Summary Do not eat or drink anything (not even water) for 2 hours after your test, or until your numbing medicine wears off. Do not use cigarettes. Do not use e-cigarettes. Get help right away if you have chest pain.  Please call our office for any questions or concerns.  [phone].  This information is not intended to replace advice given to you by your health care provider. Make sure you discuss any questions you have with your health care provider. Document Released: 05/02/2009 Document Revised: 06/17/2017 Document Reviewed: 07/23/2016 Elsevier Patient Education  2020 Elsevier  Inc.  

## 2022-04-05 NOTE — Op Note (Signed)
Video Bronchoscopy with Robotic Assisted Bronchoscopic Navigation and Endobronchial Ultrasound Procedure Note  Date of Operation: 04/05/2022   Pre-op Diagnosis: Left upper lobe pulmonary nodule, mediastinal adenopathy  Post-op Diagnosis: Same  Surgeon: Baltazar Apo  Assistants: None  Anesthesia: General endotracheal anesthesia  Operation: Flexible video fiberoptic bronchoscopy with robotic assistance and biopsies.  Estimated Blood Loss: Minimal  Complications: None  Indications and History: Jason Beard is a 76 y.o. male with history of minimal tobacco use.  He was found to have a peripheral left upper lobe pulmonary nodule on CT chest that was performed to evaluate cough.  This was hypermetabolic on PET scan 9/39/0300.  There were some minimally enlarged mediastinal nodes.  Recommendation made to achieve a tissue diagnosis via robotic assisted navigational bronchoscopy and endobronchial ultrasound. The risks, benefits, complications, treatment options and expected outcomes were discussed with the patient.  The possibilities of pneumothorax, pneumonia, reaction to medication, pulmonary aspiration, perforation of a viscus, bleeding, failure to diagnose a condition and creating a complication requiring transfusion or operation were discussed with the patient who freely signed the consent.    Description of Procedure: The patient was seen in the Preoperative Area, was examined and was deemed appropriate to proceed.  The patient was taken to [facility] endoscopy room 3, identified as ZACHARIAS RIDLING and the procedure verified as Flexible Video Fiberoptic Bronchoscopy.  A Time Out was held and the above information confirmed.   Prior to the date of the procedure a high-resolution CT scan of the chest was performed. Utilizing ION software program a virtual tracheobronchial tree was generated to allow the creation of distinct navigation pathways to the patient's parenchymal abnormalities.  After being taken to the operating room general anesthesia was initiated and the patient  was orally intubated. The video fiberoptic bronchoscope was introduced via the endotracheal tube and a general inspection was performed which showed normal right and left lung anatomy. Aspiration of the bilateral mainstems was completed to remove any remaining secretions. Robotic catheter inserted into patient's endotracheal tube.   Target #1 left upper lobe pulmonary nodule: The distinct navigation pathways prepared prior to this procedure were then utilized to navigate to patient's lesion identified on CT scan. The robotic catheter was secured into place and the vision probe was withdrawn.  Lesion location was approximated using fluoroscopy and radial endobronchial ultrasound for peripheral targeting.  Local registration and targeting was performed using Cios three-dimensional imaging.  Under fluoroscopic guidance transbronchial needle brushings, transbronchial needle biopsies, and transbronchial forceps biopsies were performed to be sent for cytology and pathology.  Needle-in-lesion was established using Cios three-dimensional imaging.  Under fluoroscopic guidance a single fiducial marker was placed adjacent to the pulmonary nodule.  A bronchioalveolar lavage was performed in the left upper lobe and sent for cytology.  The robotic scope was then withdrawn and the endobronchial ultrasound was used to identify and characterize the peritracheal, hilar and bronchial lymph nodes. Inspection showed a moderate enlarged node at station 7.  There were no other enlarged nodes accessible. Using real-time ultrasound guidance Wang needle biopsies were take from Station 7 node and was sent for cytology.  At the end of the procedure a general airway inspection was performed and there was no evidence of active bleeding. The bronchoscope was removed.  The patient tolerated the procedure well. There was no significant blood loss and  there were no obvious complications. A post-procedural chest x-ray is pending.  Samples Target #1: 1. Transbronchial needle brushings from left upper lobe pulmonary  nodule 2. Transbronchial Wang needle biopsies from left upper lobe pulmonary nodule 3. Transbronchial forceps biopsies from left upper lobe pulmonary nodule 4. Bronchoalveolar lavage from left upper lobe  EBUS samples: 1. Wang needle biopsies from 7 node  Plans:  The patient will be discharged from the PACU to home when recovered from anesthesia and after chest x-ray is reviewed. We will review the cytology, pathology results with the patient when they become available. Outpatient followup will be with Dr. Lamonte Sakai.    Baltazar Apo, MD, PhD 04/05/2022, 11:21 AM [facility] [phone] or if no answer before 7:00PM call [phone] For any issues after 7:00PM please call eLink [phone]

## 2022-04-05 NOTE — Anesthesia Procedure Notes (Signed)
Procedure Name: Intubation Date/Time: 04/05/2022 9:42 AM  Performed by: Gaylene Brooks, CRNAPre-anesthesia Checklist: Patient identified, Emergency Drugs available, Suction available and Patient being monitored Patient Re-evaluated:Patient Re-evaluated prior to induction Oxygen Delivery Method: Circle System Utilized Preoxygenation: Pre-oxygenation with 100% oxygen Induction Type: IV induction Ventilation: Mask ventilation without difficulty and Oral airway inserted - appropriate to patient size Laryngoscope Size: Sabra Heck and 2 Grade View: Grade II Tube type: Oral Tube size: 8.5 mm Number of attempts: 1 Airway Equipment and Method: Stylet and Oral airway Placement Confirmation: ETT inserted through vocal cords under direct vision, positive ETCO2 and breath sounds checked- equal and bilateral Secured at: 22 cm Tube secured with: Tape Dental Injury: Teeth and Oropharynx as per pre-operative assessment

## 2022-04-06 NOTE — Anesthesia Postprocedure Evaluation (Signed)
Anesthesia Post Note  Patient: Jason Beard  Procedure(s) Performed: ROBOTIC ASSISTED NAVIGATIONAL BRONCHOSCOPY VIDEO BRONCHOSCOPY WITH ENDOBRONCHIAL ULTRASOUND BRONCHIAL BRUSHINGS BRONCHIAL NEEDLE ASPIRATION BIOPSIES BRONCHIAL BIOPSIES VIDEO BRONCHOSCOPY WITH RADIAL ENDOBRONCHIAL ULTRASOUND     Patient location during evaluation: PACU Anesthesia Type: General Level of consciousness: awake and alert Pain management: pain level controlled Vital Signs Assessment: post-procedure vital signs reviewed and stable Respiratory status: spontaneous breathing, nonlabored ventilation, respiratory function stable and patient connected to nasal cannula oxygen Cardiovascular status: blood pressure returned to baseline and stable Postop Assessment: no apparent nausea or vomiting Anesthetic complications: no   No notable events documented.  Last Vitals:  Vitals:   04/05/22 1145 04/05/22 1200  BP: 121/66 133/67  Pulse: 60 60  Resp: 11 19  Temp:  36.4 C  SpO2: 92% 93%    Last Pain:  Vitals:   04/05/22 1145  TempSrc:   PainSc: 0-No pain                 Belenda Cruise P Felipe Cabell

## 2022-04-07 LAB — CYTOLOGY - NON PAP

## 2022-04-07 LAB — NOVEL CORONAVIRUS, NAA

## 2022-04-07 LAB — SPECIMEN STATUS REPORT

## 2022-04-09 ENCOUNTER — Telehealth: Payer: Self-pay | Admitting: Emergency Medicine

## 2022-04-09 NOTE — Telephone Encounter (Signed)
Patient calling for bronch results which was done 9/18. Coughing up blood in phlegm. Was told he would get a call from the doctor Wednesday or Thursday. Please call back at [phone].

## 2022-04-09 NOTE — Telephone Encounter (Signed)
Reviewed bronchoscopy results with the patient. Cytology negative for malignancy on TBBx and nodes. We will plan OV for follow up culture data, and to plan next CT chest  Please set him up with an OV, non-urgent

## 2022-04-09 NOTE — Telephone Encounter (Signed)
The description of coughing up blood or hemoptysis seems to be improving and is as to be expected following bronchoscopy and biopsy.  Recommend continue to keep an eye on this and if the blood worsens or develops frank bright red blood as opposed to blood-streaked sputum for him to seek emergency care over the weekend.  In terms of the results, I would wait for Dr. Lamonte Sakai to discuss with the patient when he is back at work.

## 2022-04-09 NOTE — Telephone Encounter (Signed)
Called and spoke with pt letting him know the info from Dr. Silas Flood about the bloody streaks and he verbalized understanding. Stated to pt that we have sent the message to Dr. Lamonte Sakai high priority for him to review and stated to him once he let us know the results of the bronch that we would let him know and he verbalized understanding.

## 2022-04-09 NOTE — Telephone Encounter (Signed)
Called and spoke with pt who is wanting to know if we have any of the bronch results back. Pt said he is able to see the results in mychart but does not know how to interpret any of it.  Pt also states that he has been coughing up some blood since the bronch. States first day, it was a bright red glob about the size of his finger tip but states that it is getting better. Pt said now when he coughs, he is having streaks of blood in the phlegm. Pt denies any chest discomfort.  Routing this to Dr. Lamonte Sakai but also routing this to Dr. Silas Flood for any advice for pt as Dr. Lamonte Sakai is off.

## 2022-04-12 NOTE — Telephone Encounter (Signed)
Pt scheduled for 11/7. Nothing further needed.

## 2022-04-12 NOTE — Telephone Encounter (Signed)
Called the pt and there was no answer- LMTCB and will route to front desk pool for f/u. Thanks.

## 2022-04-22 DIAGNOSIS — L281 Prurigo nodularis: Secondary | ICD-10-CM | POA: Diagnosis not present

## 2022-04-22 DIAGNOSIS — L2089 Other atopic dermatitis: Secondary | ICD-10-CM | POA: Diagnosis not present

## 2022-04-29 DIAGNOSIS — G4733 Obstructive sleep apnea (adult) (pediatric): Secondary | ICD-10-CM | POA: Diagnosis not present

## 2022-04-29 DIAGNOSIS — I700 Atherosclerosis of aorta: Secondary | ICD-10-CM | POA: Diagnosis not present

## 2022-04-29 DIAGNOSIS — N1831 Chronic kidney disease, stage 3a: Secondary | ICD-10-CM | POA: Diagnosis not present

## 2022-04-29 DIAGNOSIS — R911 Solitary pulmonary nodule: Secondary | ICD-10-CM | POA: Diagnosis not present

## 2022-04-29 DIAGNOSIS — E785 Hyperlipidemia, unspecified: Secondary | ICD-10-CM | POA: Diagnosis not present

## 2022-04-29 DIAGNOSIS — E1122 Type 2 diabetes mellitus with diabetic chronic kidney disease: Secondary | ICD-10-CM | POA: Diagnosis not present

## 2022-05-21 DIAGNOSIS — M25561 Pain in right knee: Secondary | ICD-10-CM | POA: Diagnosis not present

## 2022-05-21 DIAGNOSIS — M1711 Unilateral primary osteoarthritis, right knee: Secondary | ICD-10-CM | POA: Diagnosis not present

## 2022-05-25 ENCOUNTER — Encounter: Payer: Self-pay | Admitting: Emergency Medicine

## 2022-05-25 ENCOUNTER — Ambulatory Visit: Payer: Medicare Other | Admitting: Emergency Medicine

## 2022-05-25 VITALS — BP 146/80 | HR 56 | Temp 98.5°F | Ht 72.0 in | Wt 247.8 lb

## 2022-05-25 DIAGNOSIS — R911 Solitary pulmonary nodule: Secondary | ICD-10-CM

## 2022-05-25 DIAGNOSIS — J4520 Mild intermittent asthma, uncomplicated: Secondary | ICD-10-CM | POA: Insufficient documentation

## 2022-05-25 NOTE — Progress Notes (Signed)
   Subjective:    Patient ID: Jason Beard, male    DOB: 04/15/1946, 76 y.o.   MRN: [id number]  HPI  [name] 05/25/2022 --76 year old man with diabetes, hypertension, atrial flutter, OSA on CPAP, probable mild asthma (PFT 03/02/2022).  He underwent navigational bronchoscopy 04/05/2021 for 2.1 x 1.2 cm peripheral left lower lobe pulmonary nodule and some prominent mediastinal adenopathy.  All cytology was negative. He is feeling well, has been active. Is golfing, fishing.  Vaccinations all up to date.  He is not using albuterol    Review of Systems As per HPI      Objective:   Physical Exam  Vitals:   05/25/22 0844  BP: (!) 146/80  Pulse: (!) 56  Temp: 98.5 F (36.9 C)  TempSrc: Oral  SpO2: 97%  Weight: 247 lb 12.8 oz (112.4 kg)  Height: 6' (1.829 m)    Gen: Pleasant, well-nourished, in no distress,  normal affect  ENT: No lesions,  mouth clear,  oropharynx clear, no postnasal drip  Neck: No JVD, no stridor  Lungs: No use of accessory muscles, no crackles or wheezing on normal respiration, no wheeze on forced expiration  Cardiovascular: RRR, heart sounds normal, no murmur or gallops, no peripheral edema  Musculoskeletal: No deformities, no cyanosis or clubbing  Neuro: alert, awake, non focal  Skin: Warm, no lesions or rash     Assessment & Plan:   Pulmonary nodule Reassuring transbronchial biopsies.  The nodule actually was a bit smaller on his September CT.  We will plan a 28-monthfollow-up, March 2024.  Mild intermittent asthma Based on pulmonary function testing and probable mild obstruction.  He does quite well, good functional capacity.  No flares.  Vaccines are up-to-date.  He never needs to use his albuterol but does have it available.  Plan to continue same regimen   RBaltazar Apo MD, PhD 05/25/2022, 9:05 AM [facility] [phone] if no answer before 7:00PM call [phone] For any issues after 7:00PM please call eLink  [phone]

## 2022-05-25 NOTE — Patient Instructions (Signed)
We will plan to repeat your CT scan of the chest in March 2024. Keep albuterol available to use 2 puffs if needed for shortness of breath, chest tightness, wheezing. Vaccines are all up-to-date Follow with Dr. Lamonte Sakai in March after your CT or sooner if you have any problems.

## 2022-05-25 NOTE — Assessment & Plan Note (Signed)
Reassuring transbronchial biopsies.  The nodule actually was a bit smaller on his September CT.  We will plan a 72-monthfollow-up, March 2024.

## 2022-05-25 NOTE — Assessment & Plan Note (Addendum)
Based on pulmonary function testing and probable mild obstruction.  He does quite well, good functional capacity.  No flares.  Vaccines are up-to-date.  He never needs to use his albuterol but does have it available.  Plan to continue same regimen

## 2022-05-25 NOTE — Addendum Note (Signed)
Addended by: Chanetta Marshall on: 05/25/2022 09:35 AM   Modules accepted: Orders

## 2022-06-16 DIAGNOSIS — M1711 Unilateral primary osteoarthritis, right knee: Secondary | ICD-10-CM | POA: Diagnosis not present

## 2022-06-22 DIAGNOSIS — G4733 Obstructive sleep apnea (adult) (pediatric): Secondary | ICD-10-CM | POA: Diagnosis not present

## 2022-08-03 IMAGING — DX DG CHEST 2V
2 series · 2 of 2 positions shown · non-contrast
Comparison: 05/13/2015

CLINICAL DATA: Acute cough.

EXAM:
CHEST - 2 VIEW

[dg chest 2 view (1 of 2)]
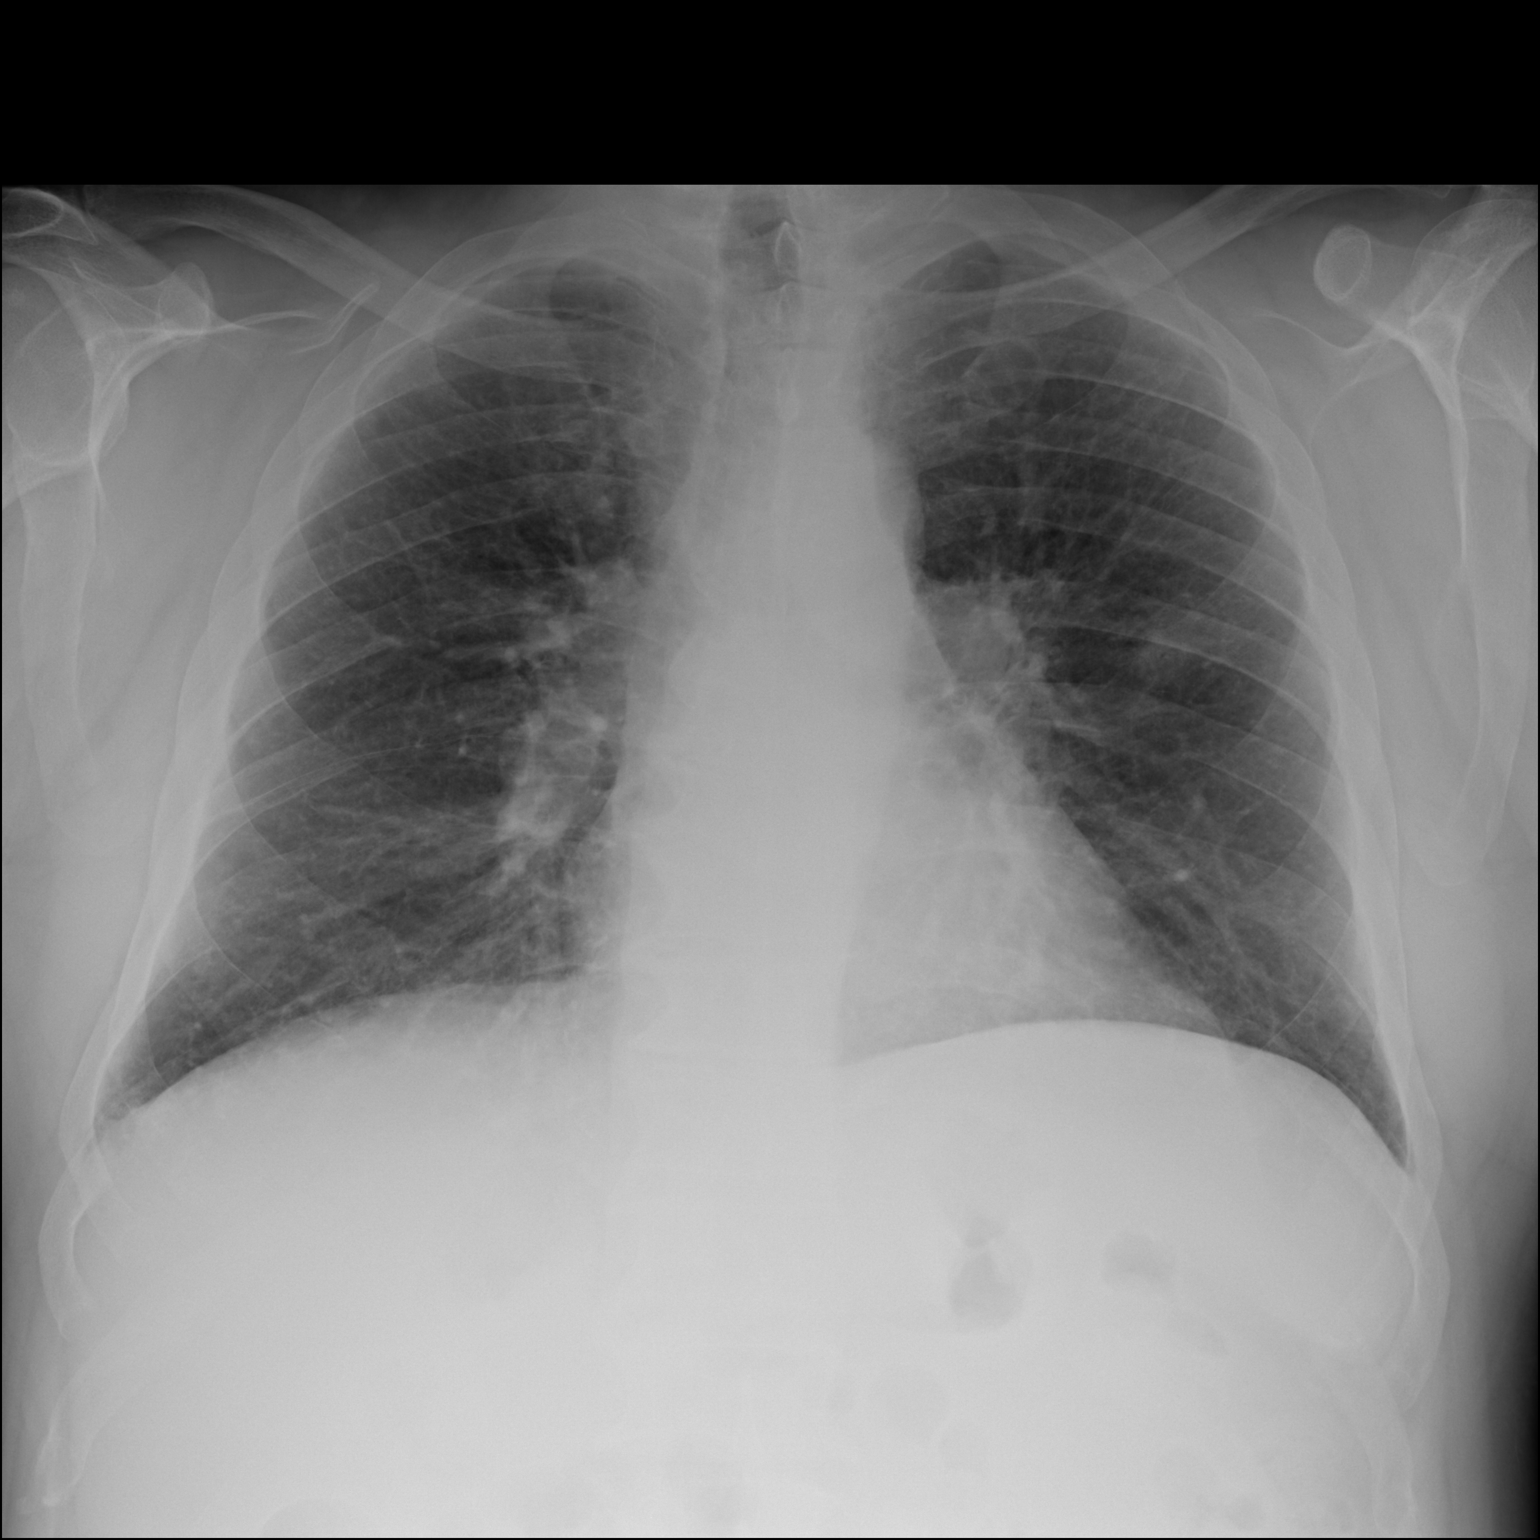

[dg chest 2 view (2 of 2)]
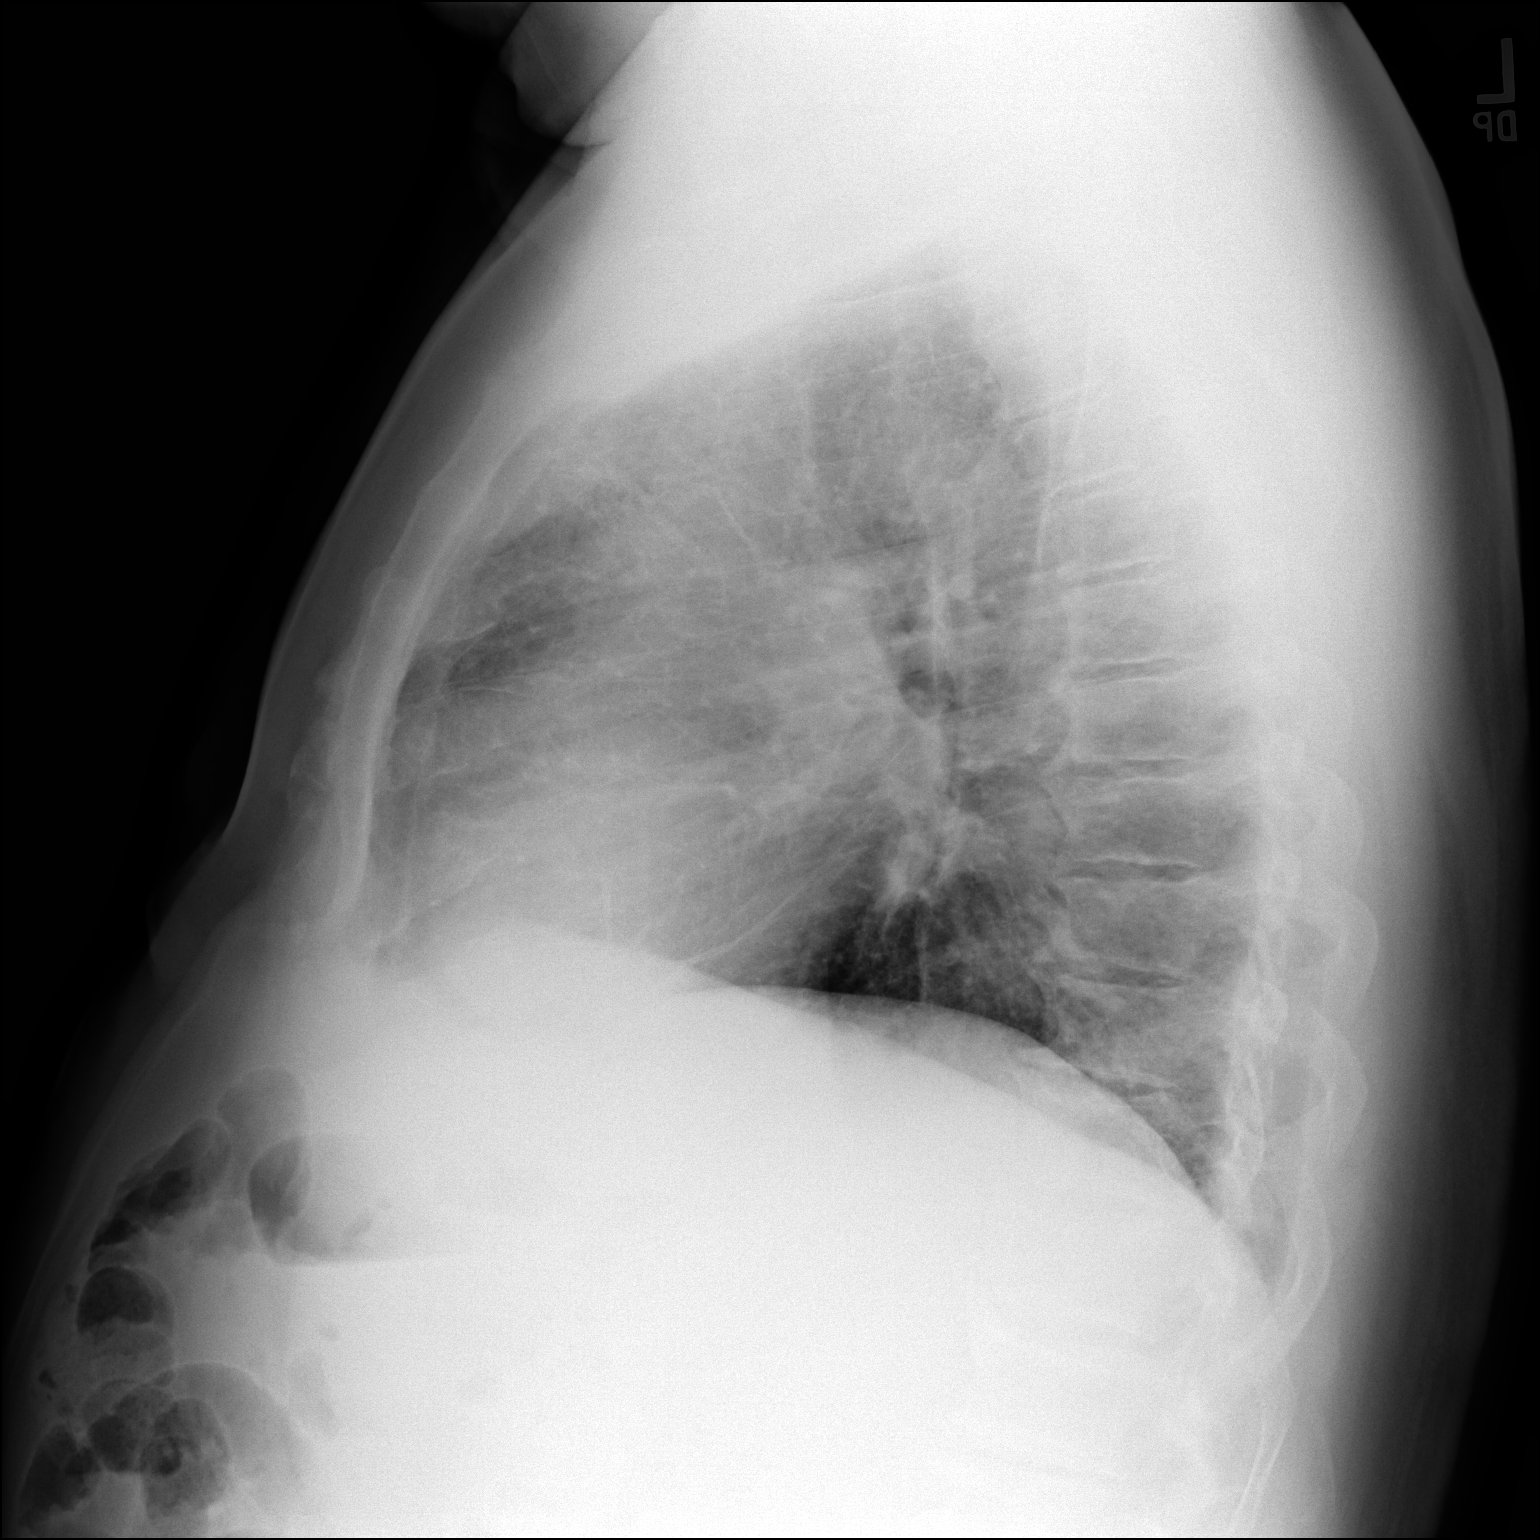

[2 of 2 positions shown; findings below may reference images not displayed]

FINDINGS: Normal heart size. No pleural effusion or edema identified. There is
a nodular density within the left midlung measuring approximately
1.2 cm. Indeterminate. No airspace consolidation. Visualized osseous
structures are notable for multilevel thoracic degenerative disc
disease.
IMPRESSION: 1. No acute cardiopulmonary abnormalities.
2. Indeterminate nodular density within the left midlung. Suggest
further evaluation with CT of the chest.

## 2022-08-16 DIAGNOSIS — Z20822 Contact with and (suspected) exposure to covid-19: Secondary | ICD-10-CM | POA: Diagnosis not present

## 2022-08-16 DIAGNOSIS — J019 Acute sinusitis, unspecified: Secondary | ICD-10-CM | POA: Diagnosis not present

## 2022-08-16 DIAGNOSIS — R070 Pain in throat: Secondary | ICD-10-CM | POA: Diagnosis not present

## 2022-08-16 DIAGNOSIS — Z1159 Encounter for screening for other viral diseases: Secondary | ICD-10-CM | POA: Diagnosis not present

## 2022-08-16 DIAGNOSIS — R059 Cough, unspecified: Secondary | ICD-10-CM | POA: Diagnosis not present

## 2022-09-20 DIAGNOSIS — G4733 Obstructive sleep apnea (adult) (pediatric): Secondary | ICD-10-CM | POA: Diagnosis not present

## 2022-09-22 ENCOUNTER — Other Ambulatory Visit: Payer: Medicare Other

## 2022-09-23 ENCOUNTER — Ambulatory Visit
Admission: RE | Admit: 2022-09-23 | Discharge: 2022-09-23 | Disposition: A | Discharge status: Home / Self Care | Payer: Medicare Other | Source: Ambulatory Visit | Attending: Emergency Medicine | Admitting: Emergency Medicine

## 2022-09-23 DIAGNOSIS — I700 Atherosclerosis of aorta: Secondary | ICD-10-CM | POA: Diagnosis not present

## 2022-09-23 DIAGNOSIS — R911 Solitary pulmonary nodule: Secondary | ICD-10-CM

## 2022-10-06 DIAGNOSIS — M1711 Unilateral primary osteoarthritis, right knee: Secondary | ICD-10-CM | POA: Diagnosis not present

## 2022-10-06 DIAGNOSIS — M25561 Pain in right knee: Secondary | ICD-10-CM | POA: Diagnosis not present

## 2022-10-06 DIAGNOSIS — G8929 Other chronic pain: Secondary | ICD-10-CM | POA: Diagnosis not present

## 2022-10-13 DIAGNOSIS — L03115 Cellulitis of right lower limb: Secondary | ICD-10-CM | POA: Diagnosis not present

## 2022-10-13 DIAGNOSIS — S80811A Abrasion, right lower leg, initial encounter: Secondary | ICD-10-CM | POA: Diagnosis not present

## 2022-10-13 DIAGNOSIS — Z7689 Persons encountering health services in other specified circumstances: Secondary | ICD-10-CM | POA: Diagnosis not present

## 2022-10-18 DIAGNOSIS — Z9181 History of falling: Secondary | ICD-10-CM | POA: Diagnosis not present

## 2022-10-18 DIAGNOSIS — T148XXA Other injury of unspecified body region, initial encounter: Secondary | ICD-10-CM | POA: Diagnosis not present

## 2022-10-28 DIAGNOSIS — D225 Melanocytic nevi of trunk: Secondary | ICD-10-CM | POA: Diagnosis not present

## 2022-10-28 DIAGNOSIS — C4442 Squamous cell carcinoma of skin of scalp and neck: Secondary | ICD-10-CM | POA: Diagnosis not present

## 2022-10-28 DIAGNOSIS — D044 Carcinoma in situ of skin of scalp and neck: Secondary | ICD-10-CM | POA: Diagnosis not present

## 2022-10-28 DIAGNOSIS — L814 Other melanin hyperpigmentation: Secondary | ICD-10-CM | POA: Diagnosis not present

## 2022-10-28 DIAGNOSIS — L821 Other seborrheic keratosis: Secondary | ICD-10-CM | POA: Diagnosis not present

## 2022-11-17 DIAGNOSIS — M1711 Unilateral primary osteoarthritis, right knee: Secondary | ICD-10-CM | POA: Diagnosis not present

## 2022-12-20 DIAGNOSIS — G4733 Obstructive sleep apnea (adult) (pediatric): Secondary | ICD-10-CM | POA: Diagnosis not present

## 2022-12-31 DIAGNOSIS — M179 Osteoarthritis of knee, unspecified: Secondary | ICD-10-CM | POA: Diagnosis not present

## 2022-12-31 DIAGNOSIS — E1122 Type 2 diabetes mellitus with diabetic chronic kidney disease: Secondary | ICD-10-CM | POA: Diagnosis not present

## 2022-12-31 DIAGNOSIS — N1831 Chronic kidney disease, stage 3a: Secondary | ICD-10-CM | POA: Diagnosis not present

## 2022-12-31 DIAGNOSIS — R42 Dizziness and giddiness: Secondary | ICD-10-CM | POA: Diagnosis not present

## 2022-12-31 DIAGNOSIS — I700 Atherosclerosis of aorta: Secondary | ICD-10-CM | POA: Diagnosis not present

## 2023-01-01 ENCOUNTER — Other Ambulatory Visit: Payer: Self-pay | Admitting: Internal Medicine

## 2023-01-01 DIAGNOSIS — R42 Dizziness and giddiness: Secondary | ICD-10-CM

## 2023-01-01 DIAGNOSIS — H532 Diplopia: Secondary | ICD-10-CM

## 2023-01-04 ENCOUNTER — Other Ambulatory Visit: Payer: Medicare Other

## 2023-01-04 ENCOUNTER — Ambulatory Visit
Admission: RE | Admit: 2023-01-04 | Discharge: 2023-01-04 | Disposition: A | Discharge status: Home / Self Care | Payer: Medicare Other | Source: Ambulatory Visit | Attending: Internal Medicine | Admitting: Internal Medicine

## 2023-01-04 DIAGNOSIS — R42 Dizziness and giddiness: Secondary | ICD-10-CM

## 2023-01-04 DIAGNOSIS — H532 Diplopia: Secondary | ICD-10-CM | POA: Diagnosis not present

## 2023-01-04 MED ORDER — GADOPICLENOL 0.5 MMOL/ML IV SOLN
10.0000 mL | Freq: Once | INTRAVENOUS | Status: AC | PRN
  Administered 2023-01-04: 10 mL via INTRAVENOUS

## 2023-01-07 DIAGNOSIS — H35372 Puckering of macula, left eye: Secondary | ICD-10-CM | POA: Diagnosis not present

## 2023-01-07 DIAGNOSIS — H532 Diplopia: Secondary | ICD-10-CM | POA: Diagnosis not present

## 2023-01-07 DIAGNOSIS — H26493 Other secondary cataract, bilateral: Secondary | ICD-10-CM | POA: Diagnosis not present

## 2023-01-07 DIAGNOSIS — H524 Presbyopia: Secondary | ICD-10-CM | POA: Diagnosis not present

## 2023-01-07 DIAGNOSIS — H40013 Open angle with borderline findings, low risk, bilateral: Secondary | ICD-10-CM | POA: Diagnosis not present

## 2023-01-07 DIAGNOSIS — E113291 Type 2 diabetes mellitus with mild nonproliferative diabetic retinopathy without macular edema, right eye: Secondary | ICD-10-CM | POA: Diagnosis not present

## 2023-01-11 DIAGNOSIS — L570 Actinic keratosis: Secondary | ICD-10-CM | POA: Diagnosis not present

## 2023-02-10 DIAGNOSIS — N1831 Chronic kidney disease, stage 3a: Secondary | ICD-10-CM | POA: Diagnosis not present

## 2023-02-10 DIAGNOSIS — Z0000 Encounter for general adult medical examination without abnormal findings: Secondary | ICD-10-CM | POA: Diagnosis not present

## 2023-02-10 DIAGNOSIS — I2510 Atherosclerotic heart disease of native coronary artery without angina pectoris: Secondary | ICD-10-CM | POA: Diagnosis not present

## 2023-02-10 DIAGNOSIS — E1122 Type 2 diabetes mellitus with diabetic chronic kidney disease: Secondary | ICD-10-CM | POA: Diagnosis not present

## 2023-02-10 DIAGNOSIS — E785 Hyperlipidemia, unspecified: Secondary | ICD-10-CM | POA: Diagnosis not present

## 2023-02-10 DIAGNOSIS — I700 Atherosclerosis of aorta: Secondary | ICD-10-CM | POA: Diagnosis not present

## 2023-03-03 DIAGNOSIS — R200 Anesthesia of skin: Secondary | ICD-10-CM | POA: Diagnosis not present

## 2023-04-12 DIAGNOSIS — H15001 Unspecified scleritis, right eye: Secondary | ICD-10-CM | POA: Diagnosis not present

## 2023-04-19 DIAGNOSIS — Z86007 Personal history of in-situ neoplasm of skin: Secondary | ICD-10-CM | POA: Diagnosis not present

## 2023-04-19 DIAGNOSIS — L821 Other seborrheic keratosis: Secondary | ICD-10-CM | POA: Diagnosis not present

## 2023-04-19 DIAGNOSIS — D225 Melanocytic nevi of trunk: Secondary | ICD-10-CM | POA: Diagnosis not present

## 2023-04-19 DIAGNOSIS — Z08 Encounter for follow-up examination after completed treatment for malignant neoplasm: Secondary | ICD-10-CM | POA: Diagnosis not present

## 2023-04-19 DIAGNOSIS — L814 Other melanin hyperpigmentation: Secondary | ICD-10-CM | POA: Diagnosis not present

## 2023-04-19 DIAGNOSIS — L570 Actinic keratosis: Secondary | ICD-10-CM | POA: Diagnosis not present

## 2023-04-19 DIAGNOSIS — Z85828 Personal history of other malignant neoplasm of skin: Secondary | ICD-10-CM | POA: Diagnosis not present

## 2023-04-25 DIAGNOSIS — M7061 Trochanteric bursitis, right hip: Secondary | ICD-10-CM | POA: Diagnosis not present

## 2023-05-24 DIAGNOSIS — Z01818 Encounter for other preprocedural examination: Secondary | ICD-10-CM | POA: Diagnosis not present

## 2023-06-08 DIAGNOSIS — M1711 Unilateral primary osteoarthritis, right knee: Secondary | ICD-10-CM | POA: Diagnosis not present

## 2023-06-08 DIAGNOSIS — G8929 Other chronic pain: Secondary | ICD-10-CM | POA: Diagnosis not present

## 2023-06-08 DIAGNOSIS — M25561 Pain in right knee: Secondary | ICD-10-CM | POA: Diagnosis not present

## 2023-06-27 DIAGNOSIS — G8918 Other acute postprocedural pain: Secondary | ICD-10-CM | POA: Diagnosis not present

## 2023-06-27 DIAGNOSIS — M25761 Osteophyte, right knee: Secondary | ICD-10-CM | POA: Diagnosis not present

## 2023-06-27 DIAGNOSIS — M1711 Unilateral primary osteoarthritis, right knee: Secondary | ICD-10-CM | POA: Diagnosis not present

## 2023-07-07 DIAGNOSIS — M25661 Stiffness of right knee, not elsewhere classified: Secondary | ICD-10-CM | POA: Diagnosis not present

## 2023-07-07 DIAGNOSIS — M25461 Effusion, right knee: Secondary | ICD-10-CM | POA: Diagnosis not present

## 2023-07-07 DIAGNOSIS — Z789 Other specified health status: Secondary | ICD-10-CM | POA: Diagnosis not present

## 2023-07-07 DIAGNOSIS — Z96651 Presence of right artificial knee joint: Secondary | ICD-10-CM | POA: Diagnosis not present

## 2023-07-07 DIAGNOSIS — Z7409 Other reduced mobility: Secondary | ICD-10-CM | POA: Diagnosis not present

## 2023-07-07 DIAGNOSIS — M25561 Pain in right knee: Secondary | ICD-10-CM | POA: Diagnosis not present

## 2023-07-11 DIAGNOSIS — M25461 Effusion, right knee: Secondary | ICD-10-CM | POA: Diagnosis not present

## 2023-07-11 DIAGNOSIS — Z789 Other specified health status: Secondary | ICD-10-CM | POA: Diagnosis not present

## 2023-07-11 DIAGNOSIS — M25561 Pain in right knee: Secondary | ICD-10-CM | POA: Diagnosis not present

## 2023-07-11 DIAGNOSIS — Z96651 Presence of right artificial knee joint: Secondary | ICD-10-CM | POA: Diagnosis not present

## 2023-07-11 DIAGNOSIS — Z7409 Other reduced mobility: Secondary | ICD-10-CM | POA: Diagnosis not present

## 2023-07-11 DIAGNOSIS — M25661 Stiffness of right knee, not elsewhere classified: Secondary | ICD-10-CM | POA: Diagnosis not present

## 2023-07-18 DIAGNOSIS — M25461 Effusion, right knee: Secondary | ICD-10-CM | POA: Diagnosis not present

## 2023-07-18 DIAGNOSIS — Z7409 Other reduced mobility: Secondary | ICD-10-CM | POA: Diagnosis not present

## 2023-07-18 DIAGNOSIS — M7061 Trochanteric bursitis, right hip: Secondary | ICD-10-CM | POA: Diagnosis not present

## 2023-07-18 DIAGNOSIS — Z96651 Presence of right artificial knee joint: Secondary | ICD-10-CM | POA: Diagnosis not present

## 2023-07-18 DIAGNOSIS — M25661 Stiffness of right knee, not elsewhere classified: Secondary | ICD-10-CM | POA: Diagnosis not present

## 2023-07-18 DIAGNOSIS — M25561 Pain in right knee: Secondary | ICD-10-CM | POA: Diagnosis not present

## 2023-07-18 DIAGNOSIS — Z789 Other specified health status: Secondary | ICD-10-CM | POA: Diagnosis not present

## 2023-07-21 DIAGNOSIS — M25661 Stiffness of right knee, not elsewhere classified: Secondary | ICD-10-CM | POA: Diagnosis not present

## 2023-07-21 DIAGNOSIS — Z96651 Presence of right artificial knee joint: Secondary | ICD-10-CM | POA: Diagnosis not present

## 2023-07-21 DIAGNOSIS — M25461 Effusion, right knee: Secondary | ICD-10-CM | POA: Diagnosis not present

## 2023-07-21 DIAGNOSIS — Z7409 Other reduced mobility: Secondary | ICD-10-CM | POA: Diagnosis not present

## 2023-07-21 DIAGNOSIS — M25561 Pain in right knee: Secondary | ICD-10-CM | POA: Diagnosis not present

## 2023-07-25 DIAGNOSIS — M25561 Pain in right knee: Secondary | ICD-10-CM | POA: Diagnosis not present

## 2023-07-25 DIAGNOSIS — Z96651 Presence of right artificial knee joint: Secondary | ICD-10-CM | POA: Diagnosis not present

## 2023-07-25 DIAGNOSIS — Z789 Other specified health status: Secondary | ICD-10-CM | POA: Diagnosis not present

## 2023-07-25 DIAGNOSIS — M25461 Effusion, right knee: Secondary | ICD-10-CM | POA: Diagnosis not present

## 2023-07-25 DIAGNOSIS — Z7409 Other reduced mobility: Secondary | ICD-10-CM | POA: Diagnosis not present

## 2023-07-25 DIAGNOSIS — M25661 Stiffness of right knee, not elsewhere classified: Secondary | ICD-10-CM | POA: Diagnosis not present

## 2023-07-28 DIAGNOSIS — Z789 Other specified health status: Secondary | ICD-10-CM | POA: Diagnosis not present

## 2023-07-28 DIAGNOSIS — M25661 Stiffness of right knee, not elsewhere classified: Secondary | ICD-10-CM | POA: Diagnosis not present

## 2023-07-28 DIAGNOSIS — Z7409 Other reduced mobility: Secondary | ICD-10-CM | POA: Diagnosis not present

## 2023-07-28 DIAGNOSIS — M25561 Pain in right knee: Secondary | ICD-10-CM | POA: Diagnosis not present

## 2023-07-28 DIAGNOSIS — Z96651 Presence of right artificial knee joint: Secondary | ICD-10-CM | POA: Diagnosis not present

## 2023-07-28 DIAGNOSIS — M25461 Effusion, right knee: Secondary | ICD-10-CM | POA: Diagnosis not present

## 2023-08-01 DIAGNOSIS — Z789 Other specified health status: Secondary | ICD-10-CM | POA: Diagnosis not present

## 2023-08-01 DIAGNOSIS — M25661 Stiffness of right knee, not elsewhere classified: Secondary | ICD-10-CM | POA: Diagnosis not present

## 2023-08-01 DIAGNOSIS — M25561 Pain in right knee: Secondary | ICD-10-CM | POA: Diagnosis not present

## 2023-08-01 DIAGNOSIS — Z7409 Other reduced mobility: Secondary | ICD-10-CM | POA: Diagnosis not present

## 2023-08-01 DIAGNOSIS — Z96651 Presence of right artificial knee joint: Secondary | ICD-10-CM | POA: Diagnosis not present

## 2023-08-01 DIAGNOSIS — M25461 Effusion, right knee: Secondary | ICD-10-CM | POA: Diagnosis not present

## 2023-08-01 DIAGNOSIS — G8929 Other chronic pain: Secondary | ICD-10-CM | POA: Diagnosis not present

## 2023-08-04 DIAGNOSIS — Z7409 Other reduced mobility: Secondary | ICD-10-CM | POA: Diagnosis not present

## 2023-08-04 DIAGNOSIS — Z789 Other specified health status: Secondary | ICD-10-CM | POA: Diagnosis not present

## 2023-08-04 DIAGNOSIS — M25661 Stiffness of right knee, not elsewhere classified: Secondary | ICD-10-CM | POA: Diagnosis not present

## 2023-08-04 DIAGNOSIS — M25461 Effusion, right knee: Secondary | ICD-10-CM | POA: Diagnosis not present

## 2023-08-04 DIAGNOSIS — M25561 Pain in right knee: Secondary | ICD-10-CM | POA: Diagnosis not present

## 2023-08-04 DIAGNOSIS — Z96651 Presence of right artificial knee joint: Secondary | ICD-10-CM | POA: Diagnosis not present

## 2023-08-09 DIAGNOSIS — Z7409 Other reduced mobility: Secondary | ICD-10-CM | POA: Diagnosis not present

## 2023-08-09 DIAGNOSIS — M25561 Pain in right knee: Secondary | ICD-10-CM | POA: Diagnosis not present

## 2023-08-09 DIAGNOSIS — R29898 Other symptoms and signs involving the musculoskeletal system: Secondary | ICD-10-CM | POA: Diagnosis not present

## 2023-08-09 DIAGNOSIS — M25661 Stiffness of right knee, not elsewhere classified: Secondary | ICD-10-CM | POA: Diagnosis not present

## 2023-08-09 DIAGNOSIS — Z96651 Presence of right artificial knee joint: Secondary | ICD-10-CM | POA: Diagnosis not present

## 2023-08-09 DIAGNOSIS — Z789 Other specified health status: Secondary | ICD-10-CM | POA: Diagnosis not present

## 2023-08-09 DIAGNOSIS — M25461 Effusion, right knee: Secondary | ICD-10-CM | POA: Diagnosis not present

## 2023-08-11 DIAGNOSIS — Z96651 Presence of right artificial knee joint: Secondary | ICD-10-CM | POA: Diagnosis not present

## 2023-08-11 DIAGNOSIS — M25661 Stiffness of right knee, not elsewhere classified: Secondary | ICD-10-CM | POA: Diagnosis not present

## 2023-08-11 DIAGNOSIS — R29898 Other symptoms and signs involving the musculoskeletal system: Secondary | ICD-10-CM | POA: Diagnosis not present

## 2023-08-11 DIAGNOSIS — M25461 Effusion, right knee: Secondary | ICD-10-CM | POA: Diagnosis not present

## 2023-08-11 DIAGNOSIS — Z7409 Other reduced mobility: Secondary | ICD-10-CM | POA: Diagnosis not present

## 2023-08-11 DIAGNOSIS — M25561 Pain in right knee: Secondary | ICD-10-CM | POA: Diagnosis not present

## 2023-08-11 DIAGNOSIS — Z789 Other specified health status: Secondary | ICD-10-CM | POA: Diagnosis not present

## 2023-08-12 DIAGNOSIS — E785 Hyperlipidemia, unspecified: Secondary | ICD-10-CM | POA: Diagnosis not present

## 2023-08-12 DIAGNOSIS — M1990 Unspecified osteoarthritis, unspecified site: Secondary | ICD-10-CM | POA: Diagnosis not present

## 2023-08-12 DIAGNOSIS — I700 Atherosclerosis of aorta: Secondary | ICD-10-CM | POA: Diagnosis not present

## 2023-08-12 DIAGNOSIS — N1831 Chronic kidney disease, stage 3a: Secondary | ICD-10-CM | POA: Diagnosis not present

## 2023-08-12 DIAGNOSIS — E1122 Type 2 diabetes mellitus with diabetic chronic kidney disease: Secondary | ICD-10-CM | POA: Diagnosis not present

## 2023-08-12 DIAGNOSIS — I2510 Atherosclerotic heart disease of native coronary artery without angina pectoris: Secondary | ICD-10-CM | POA: Diagnosis not present

## 2023-08-12 DIAGNOSIS — E113293 Type 2 diabetes mellitus with mild nonproliferative diabetic retinopathy without macular edema, bilateral: Secondary | ICD-10-CM | POA: Diagnosis not present

## 2023-08-12 DIAGNOSIS — G4700 Insomnia, unspecified: Secondary | ICD-10-CM | POA: Diagnosis not present

## 2023-08-15 DIAGNOSIS — R29898 Other symptoms and signs involving the musculoskeletal system: Secondary | ICD-10-CM | POA: Diagnosis not present

## 2023-08-15 DIAGNOSIS — M25561 Pain in right knee: Secondary | ICD-10-CM | POA: Diagnosis not present

## 2023-08-15 DIAGNOSIS — Z7409 Other reduced mobility: Secondary | ICD-10-CM | POA: Diagnosis not present

## 2023-08-15 DIAGNOSIS — M25661 Stiffness of right knee, not elsewhere classified: Secondary | ICD-10-CM | POA: Diagnosis not present

## 2023-08-15 DIAGNOSIS — M25461 Effusion, right knee: Secondary | ICD-10-CM | POA: Diagnosis not present

## 2023-08-15 DIAGNOSIS — Z789 Other specified health status: Secondary | ICD-10-CM | POA: Diagnosis not present

## 2023-08-15 DIAGNOSIS — Z96651 Presence of right artificial knee joint: Secondary | ICD-10-CM | POA: Diagnosis not present

## 2023-08-18 DIAGNOSIS — M25561 Pain in right knee: Secondary | ICD-10-CM | POA: Diagnosis not present

## 2023-08-18 DIAGNOSIS — Z96651 Presence of right artificial knee joint: Secondary | ICD-10-CM | POA: Diagnosis not present

## 2023-08-18 DIAGNOSIS — R29898 Other symptoms and signs involving the musculoskeletal system: Secondary | ICD-10-CM | POA: Diagnosis not present

## 2023-08-18 DIAGNOSIS — Z7409 Other reduced mobility: Secondary | ICD-10-CM | POA: Diagnosis not present

## 2023-08-18 DIAGNOSIS — Z789 Other specified health status: Secondary | ICD-10-CM | POA: Diagnosis not present

## 2023-08-18 DIAGNOSIS — M25461 Effusion, right knee: Secondary | ICD-10-CM | POA: Diagnosis not present

## 2023-08-18 DIAGNOSIS — M25661 Stiffness of right knee, not elsewhere classified: Secondary | ICD-10-CM | POA: Diagnosis not present

## 2023-08-26 DIAGNOSIS — M25461 Effusion, right knee: Secondary | ICD-10-CM | POA: Diagnosis not present

## 2023-08-26 DIAGNOSIS — M25561 Pain in right knee: Secondary | ICD-10-CM | POA: Diagnosis not present

## 2023-08-26 DIAGNOSIS — R29898 Other symptoms and signs involving the musculoskeletal system: Secondary | ICD-10-CM | POA: Diagnosis not present

## 2023-08-26 DIAGNOSIS — G8929 Other chronic pain: Secondary | ICD-10-CM | POA: Diagnosis not present

## 2023-08-26 DIAGNOSIS — Z96651 Presence of right artificial knee joint: Secondary | ICD-10-CM | POA: Diagnosis not present

## 2023-08-26 DIAGNOSIS — M1711 Unilateral primary osteoarthritis, right knee: Secondary | ICD-10-CM | POA: Diagnosis not present

## 2023-08-26 DIAGNOSIS — M25661 Stiffness of right knee, not elsewhere classified: Secondary | ICD-10-CM | POA: Diagnosis not present

## 2023-08-26 DIAGNOSIS — Z7409 Other reduced mobility: Secondary | ICD-10-CM | POA: Diagnosis not present

## 2023-08-26 DIAGNOSIS — Z789 Other specified health status: Secondary | ICD-10-CM | POA: Diagnosis not present

## 2023-08-30 DIAGNOSIS — M25561 Pain in right knee: Secondary | ICD-10-CM | POA: Diagnosis not present

## 2023-08-30 DIAGNOSIS — Z96651 Presence of right artificial knee joint: Secondary | ICD-10-CM | POA: Diagnosis not present

## 2023-08-30 DIAGNOSIS — R29898 Other symptoms and signs involving the musculoskeletal system: Secondary | ICD-10-CM | POA: Diagnosis not present

## 2023-08-30 DIAGNOSIS — M25661 Stiffness of right knee, not elsewhere classified: Secondary | ICD-10-CM | POA: Diagnosis not present

## 2023-08-30 DIAGNOSIS — Z7409 Other reduced mobility: Secondary | ICD-10-CM | POA: Diagnosis not present

## 2023-08-30 DIAGNOSIS — M25461 Effusion, right knee: Secondary | ICD-10-CM | POA: Diagnosis not present

## 2023-08-30 DIAGNOSIS — Z789 Other specified health status: Secondary | ICD-10-CM | POA: Diagnosis not present

## 2023-09-01 DIAGNOSIS — M25561 Pain in right knee: Secondary | ICD-10-CM | POA: Diagnosis not present

## 2023-09-01 DIAGNOSIS — M25461 Effusion, right knee: Secondary | ICD-10-CM | POA: Diagnosis not present

## 2023-09-01 DIAGNOSIS — R29898 Other symptoms and signs involving the musculoskeletal system: Secondary | ICD-10-CM | POA: Diagnosis not present

## 2023-09-01 DIAGNOSIS — Z7409 Other reduced mobility: Secondary | ICD-10-CM | POA: Diagnosis not present

## 2023-09-01 DIAGNOSIS — M1711 Unilateral primary osteoarthritis, right knee: Secondary | ICD-10-CM | POA: Diagnosis not present

## 2023-09-01 DIAGNOSIS — G8929 Other chronic pain: Secondary | ICD-10-CM | POA: Diagnosis not present

## 2023-09-01 DIAGNOSIS — Z789 Other specified health status: Secondary | ICD-10-CM | POA: Diagnosis not present

## 2023-09-01 DIAGNOSIS — Z96651 Presence of right artificial knee joint: Secondary | ICD-10-CM | POA: Diagnosis not present

## 2023-09-01 DIAGNOSIS — M25661 Stiffness of right knee, not elsewhere classified: Secondary | ICD-10-CM | POA: Diagnosis not present

## 2023-09-05 DIAGNOSIS — Z96651 Presence of right artificial knee joint: Secondary | ICD-10-CM | POA: Diagnosis not present

## 2023-09-05 DIAGNOSIS — M25661 Stiffness of right knee, not elsewhere classified: Secondary | ICD-10-CM | POA: Diagnosis not present

## 2023-09-05 DIAGNOSIS — Z7409 Other reduced mobility: Secondary | ICD-10-CM | POA: Diagnosis not present

## 2023-09-05 DIAGNOSIS — M25461 Effusion, right knee: Secondary | ICD-10-CM | POA: Diagnosis not present

## 2023-09-05 DIAGNOSIS — G8929 Other chronic pain: Secondary | ICD-10-CM | POA: Diagnosis not present

## 2023-09-05 DIAGNOSIS — Z789 Other specified health status: Secondary | ICD-10-CM | POA: Diagnosis not present

## 2023-09-05 DIAGNOSIS — M1711 Unilateral primary osteoarthritis, right knee: Secondary | ICD-10-CM | POA: Diagnosis not present

## 2023-09-05 DIAGNOSIS — R29898 Other symptoms and signs involving the musculoskeletal system: Secondary | ICD-10-CM | POA: Diagnosis not present

## 2023-09-05 DIAGNOSIS — M25561 Pain in right knee: Secondary | ICD-10-CM | POA: Diagnosis not present

## 2023-09-08 DIAGNOSIS — Z96651 Presence of right artificial knee joint: Secondary | ICD-10-CM | POA: Diagnosis not present

## 2023-09-08 DIAGNOSIS — Z789 Other specified health status: Secondary | ICD-10-CM | POA: Diagnosis not present

## 2023-09-08 DIAGNOSIS — M25461 Effusion, right knee: Secondary | ICD-10-CM | POA: Diagnosis not present

## 2023-09-08 DIAGNOSIS — R29898 Other symptoms and signs involving the musculoskeletal system: Secondary | ICD-10-CM | POA: Diagnosis not present

## 2023-09-08 DIAGNOSIS — M1711 Unilateral primary osteoarthritis, right knee: Secondary | ICD-10-CM | POA: Diagnosis not present

## 2023-09-08 DIAGNOSIS — M25661 Stiffness of right knee, not elsewhere classified: Secondary | ICD-10-CM | POA: Diagnosis not present

## 2023-09-08 DIAGNOSIS — G8929 Other chronic pain: Secondary | ICD-10-CM | POA: Diagnosis not present

## 2023-09-08 DIAGNOSIS — M25561 Pain in right knee: Secondary | ICD-10-CM | POA: Diagnosis not present

## 2023-09-08 DIAGNOSIS — Z7409 Other reduced mobility: Secondary | ICD-10-CM | POA: Diagnosis not present

## 2023-09-12 DIAGNOSIS — M25661 Stiffness of right knee, not elsewhere classified: Secondary | ICD-10-CM | POA: Diagnosis not present

## 2023-09-12 DIAGNOSIS — Z96651 Presence of right artificial knee joint: Secondary | ICD-10-CM | POA: Diagnosis not present

## 2023-09-12 DIAGNOSIS — M25461 Effusion, right knee: Secondary | ICD-10-CM | POA: Diagnosis not present

## 2023-09-12 DIAGNOSIS — M25561 Pain in right knee: Secondary | ICD-10-CM | POA: Diagnosis not present

## 2023-09-12 DIAGNOSIS — Z789 Other specified health status: Secondary | ICD-10-CM | POA: Diagnosis not present

## 2023-09-12 DIAGNOSIS — Z7409 Other reduced mobility: Secondary | ICD-10-CM | POA: Diagnosis not present

## 2023-09-15 DIAGNOSIS — M25661 Stiffness of right knee, not elsewhere classified: Secondary | ICD-10-CM | POA: Diagnosis not present

## 2023-09-15 DIAGNOSIS — Z96651 Presence of right artificial knee joint: Secondary | ICD-10-CM | POA: Diagnosis not present

## 2023-09-15 DIAGNOSIS — R29898 Other symptoms and signs involving the musculoskeletal system: Secondary | ICD-10-CM | POA: Diagnosis not present

## 2023-09-15 DIAGNOSIS — Z789 Other specified health status: Secondary | ICD-10-CM | POA: Diagnosis not present

## 2023-09-15 DIAGNOSIS — M25561 Pain in right knee: Secondary | ICD-10-CM | POA: Diagnosis not present

## 2023-09-15 DIAGNOSIS — Z7409 Other reduced mobility: Secondary | ICD-10-CM | POA: Diagnosis not present

## 2023-09-15 DIAGNOSIS — M25461 Effusion, right knee: Secondary | ICD-10-CM | POA: Diagnosis not present

## 2023-09-19 DIAGNOSIS — H903 Sensorineural hearing loss, bilateral: Secondary | ICD-10-CM | POA: Diagnosis not present

## 2023-09-19 DIAGNOSIS — H9113 Presbycusis, bilateral: Secondary | ICD-10-CM | POA: Diagnosis not present

## 2023-10-03 ENCOUNTER — Encounter: Payer: Self-pay | Admitting: Emergency Medicine

## 2023-11-01 DIAGNOSIS — D225 Melanocytic nevi of trunk: Secondary | ICD-10-CM | POA: Diagnosis not present

## 2023-11-01 DIAGNOSIS — L814 Other melanin hyperpigmentation: Secondary | ICD-10-CM | POA: Diagnosis not present

## 2023-11-01 DIAGNOSIS — L821 Other seborrheic keratosis: Secondary | ICD-10-CM | POA: Diagnosis not present

## 2023-11-01 DIAGNOSIS — Z85828 Personal history of other malignant neoplasm of skin: Secondary | ICD-10-CM | POA: Diagnosis not present

## 2023-11-01 DIAGNOSIS — Z08 Encounter for follow-up examination after completed treatment for malignant neoplasm: Secondary | ICD-10-CM | POA: Diagnosis not present

## 2023-11-01 DIAGNOSIS — L918 Other hypertrophic disorders of the skin: Secondary | ICD-10-CM | POA: Diagnosis not present

## 2024-01-16 DIAGNOSIS — N1831 Chronic kidney disease, stage 3a: Secondary | ICD-10-CM | POA: Diagnosis not present

## 2024-01-16 DIAGNOSIS — M1990 Unspecified osteoarthritis, unspecified site: Secondary | ICD-10-CM | POA: Diagnosis not present

## 2024-01-16 DIAGNOSIS — E113293 Type 2 diabetes mellitus with mild nonproliferative diabetic retinopathy without macular edema, bilateral: Secondary | ICD-10-CM | POA: Diagnosis not present

## 2024-01-19 DIAGNOSIS — G4733 Obstructive sleep apnea (adult) (pediatric): Secondary | ICD-10-CM | POA: Diagnosis not present

## 2024-01-30 DIAGNOSIS — H04123 Dry eye syndrome of bilateral lacrimal glands: Secondary | ICD-10-CM | POA: Diagnosis not present

## 2024-01-30 DIAGNOSIS — H43811 Vitreous degeneration, right eye: Secondary | ICD-10-CM | POA: Diagnosis not present

## 2024-01-30 DIAGNOSIS — E119 Type 2 diabetes mellitus without complications: Secondary | ICD-10-CM | POA: Diagnosis not present

## 2024-01-30 DIAGNOSIS — H40013 Open angle with borderline findings, low risk, bilateral: Secondary | ICD-10-CM | POA: Diagnosis not present

## 2024-02-14 DIAGNOSIS — I700 Atherosclerosis of aorta: Secondary | ICD-10-CM | POA: Diagnosis not present

## 2024-02-14 DIAGNOSIS — Z0000 Encounter for general adult medical examination without abnormal findings: Secondary | ICD-10-CM | POA: Diagnosis not present

## 2024-02-14 DIAGNOSIS — K760 Fatty (change of) liver, not elsewhere classified: Secondary | ICD-10-CM | POA: Diagnosis not present

## 2024-02-14 DIAGNOSIS — E1122 Type 2 diabetes mellitus with diabetic chronic kidney disease: Secondary | ICD-10-CM | POA: Diagnosis not present

## 2024-02-14 DIAGNOSIS — E785 Hyperlipidemia, unspecified: Secondary | ICD-10-CM | POA: Diagnosis not present

## 2024-02-14 DIAGNOSIS — M1990 Unspecified osteoarthritis, unspecified site: Secondary | ICD-10-CM | POA: Diagnosis not present

## 2024-02-14 DIAGNOSIS — I10 Essential (primary) hypertension: Secondary | ICD-10-CM | POA: Diagnosis not present

## 2024-02-14 DIAGNOSIS — N1831 Chronic kidney disease, stage 3a: Secondary | ICD-10-CM | POA: Diagnosis not present

## 2024-02-14 DIAGNOSIS — Z23 Encounter for immunization: Secondary | ICD-10-CM | POA: Diagnosis not present

## 2024-02-14 DIAGNOSIS — I2510 Atherosclerotic heart disease of native coronary artery without angina pectoris: Secondary | ICD-10-CM | POA: Diagnosis not present

## 2024-02-14 DIAGNOSIS — G4733 Obstructive sleep apnea (adult) (pediatric): Secondary | ICD-10-CM | POA: Diagnosis not present

## 2024-02-16 DIAGNOSIS — M1990 Unspecified osteoarthritis, unspecified site: Secondary | ICD-10-CM | POA: Diagnosis not present

## 2024-02-16 DIAGNOSIS — N1831 Chronic kidney disease, stage 3a: Secondary | ICD-10-CM | POA: Diagnosis not present

## 2024-02-16 DIAGNOSIS — E113293 Type 2 diabetes mellitus with mild nonproliferative diabetic retinopathy without macular edema, bilateral: Secondary | ICD-10-CM | POA: Diagnosis not present

## 2024-02-23 ENCOUNTER — Ambulatory Visit: Admitting: Internal Medicine

## 2024-03-18 DIAGNOSIS — E113293 Type 2 diabetes mellitus with mild nonproliferative diabetic retinopathy without macular edema, bilateral: Secondary | ICD-10-CM | POA: Diagnosis not present

## 2024-03-18 DIAGNOSIS — N1831 Chronic kidney disease, stage 3a: Secondary | ICD-10-CM | POA: Diagnosis not present

## 2024-03-18 DIAGNOSIS — M1990 Unspecified osteoarthritis, unspecified site: Secondary | ICD-10-CM | POA: Diagnosis not present

## 2024-04-11 NOTE — Telephone Encounter (Signed)
 ATC x1. No answer, sent mychart message.

## 2024-04-12 ENCOUNTER — Ambulatory Visit: Admitting: Emergency Medicine

## 2024-04-12 ENCOUNTER — Encounter: Payer: Self-pay | Admitting: Emergency Medicine

## 2024-04-12 VITALS — BP 130/80 | HR 59 | Temp 97.8°F | Ht 72.0 in | Wt 239.0 lb

## 2024-04-12 DIAGNOSIS — R911 Solitary pulmonary nodule: Secondary | ICD-10-CM

## 2024-04-12 DIAGNOSIS — J4520 Mild intermittent asthma, uncomplicated: Secondary | ICD-10-CM

## 2024-04-12 NOTE — Progress Notes (Signed)
   Subjective:    Patient ID: Jason Beard, male    DOB: [birth year], 78 y.o.    MRN: [id number]  HPI  [name] 05/25/2022 --78 year old man with diabetes, hypertension, atrial flutter, OSA on CPAP, probable mild asthma (PFT 03/02/2022).  He underwent navigational bronchoscopy 04/05/2021 for 2.1 x 1.2 cm peripheral left lower lobe pulmonary nodule and some prominent mediastinal adenopathy.  All cytology was negative. He is feeling well, has been active. Is golfing, fishing.  Vaccinations all up to date.  He is not using albuterol   [name] 04/12/2024 --follow-up visit for 78 year old gentleman with a history of OSA on CPAP, suspected mild asthma.  Also with diabetes, hypertension and a flutter.  He had a navigational bronchoscopy 03/2021 for adenopathy and a peripheral left lower lobe pulmonary nodule.  The cytology on that study was negative.  We have been following serial imaging.  His most recent CT chest was 09/23/2022 as below He is breathing well. Golfs, exercises. No cough.   CT scan of the chest 09/23/2022 reviewed by me, shows a decrease in size of his left pleural-based nodular opacity now 12 x 9 mm with some associated bandlike changes extending toward the left hilum from the pleura.  No new pulmonary nodule seen.   Review of Systems As per HPI      Objective:   Physical Exam  Vitals:   04/12/24 1406  BP: 130/80  Pulse: (!) 59  Temp: 97.8 F (36.6 C)  TempSrc: Temporal  SpO2: 96%  Weight: 239 lb (108.4 kg)  Height: 6' (1.829 m)    Gen: Pleasant, well-nourished, in no distress,  normal affect  ENT: No lesions,  mouth clear,  oropharynx clear, no postnasal drip  Neck: No JVD, no stridor  Lungs: No use of accessory muscles, no crackles or wheezing on normal respiration, no wheeze on forced expiration  Cardiovascular: RRR, heart sounds normal, no murmur or gallops, no peripheral edema  Musculoskeletal: No deformities, no cyanosis or clubbing  Neuro: alert, awake, non  focal  Skin: Warm, no lesions or rash     Assessment & Plan:   Pulmonary nodule Evaluation of his left lower lobe pulmonary nodule has been reassuring.  His navigational bronchoscopy was negative for malignancy and then subsequent CTs showed that the nodule had decreased in size.  No clear indication to continue surveillance.  Will defer any repeat CT chest unless he has a clinical change.  Discussed this with him today.  Will follow-up in 1 year to ensure he continues to do well.  Mild intermittent asthma Minimal symptoms with great functional capacity and exercise tolerance.  He is quite active.  He does not use albuterol.  No indication to start scheduled BD therapy at this time.  He will let me know if respiratory symptoms evolve.    Lamar Chris, MD, PhD 04/12/2024, 9:17 PM [facility] [phone] or if no answer before 7:00PM call [phone] For any issues after 7:00PM please call eLink [phone]

## 2024-04-12 NOTE — Assessment & Plan Note (Signed)
 Evaluation of his left lower lobe pulmonary nodule has been reassuring.  His navigational bronchoscopy was negative for malignancy and then subsequent CTs showed that the nodule had decreased in size.  No clear indication to continue surveillance.  Will defer any repeat CT chest unless he has a clinical change.  Discussed this with him today.  Will follow-up in 1 year to ensure he continues to do well.

## 2024-04-12 NOTE — Patient Instructions (Signed)
 I am glad that you have been feeling well. We will hold off on starting any inhaled medication at this time. We reviewed your prior studies including your lung biopsy and previous CT scans of the chest.  We can hold off on a repeat CT chest for now. Please notify us  if you develop any new respiratory symptoms.  If so we may decide to repeat your imaging at that time. Follow Dr. Shelah in 1 year

## 2024-04-12 NOTE — Assessment & Plan Note (Signed)
 Minimal symptoms with great functional capacity and exercise tolerance.  He is quite active.  He does not use albuterol.  No indication to start scheduled BD therapy at this time.  He will let me know if respiratory symptoms evolve.

## 2024-04-19 DIAGNOSIS — L821 Other seborrheic keratosis: Secondary | ICD-10-CM | POA: Diagnosis not present

## 2024-04-19 DIAGNOSIS — D225 Melanocytic nevi of trunk: Secondary | ICD-10-CM | POA: Diagnosis not present

## 2024-04-19 DIAGNOSIS — Z08 Encounter for follow-up examination after completed treatment for malignant neoplasm: Secondary | ICD-10-CM | POA: Diagnosis not present

## 2024-04-19 DIAGNOSIS — Z86007 Personal history of in-situ neoplasm of skin: Secondary | ICD-10-CM | POA: Diagnosis not present

## 2024-04-19 DIAGNOSIS — L814 Other melanin hyperpigmentation: Secondary | ICD-10-CM | POA: Diagnosis not present

## 2024-05-03 DIAGNOSIS — G4733 Obstructive sleep apnea (adult) (pediatric): Secondary | ICD-10-CM | POA: Diagnosis not present

## 2024-05-25 ENCOUNTER — Other Ambulatory Visit: Payer: Self-pay | Admitting: Nurse Practitioner

## 2024-05-25 DIAGNOSIS — N402 Nodular prostate without lower urinary tract symptoms: Secondary | ICD-10-CM

## 2024-06-24 ENCOUNTER — Inpatient Hospital Stay: Admission: RE | Admit: 2024-06-24 | Discharge: 2024-06-24 | Attending: Nurse Practitioner

## 2024-06-24 DIAGNOSIS — N402 Nodular prostate without lower urinary tract symptoms: Secondary | ICD-10-CM

## 2024-06-24 MED ORDER — GADOPICLENOL 0.5 MMOL/ML IV SOLN
10.0000 mL | Freq: Once | INTRAVENOUS | Status: AC | PRN
  Administered 2024-06-24: 10 mL via INTRAVENOUS
